# Patient Record
Sex: Female | Born: 1975 | Race: White | Hispanic: No | Marital: Married | Smoking: Never smoker
Health system: Southern US, Community
[De-identification: ages and names within clinical notes are randomized; demographics above are authoritative.]

## PROBLEM LIST (undated history)

## (undated) DIAGNOSIS — L732 Hidradenitis suppurativa: Secondary | ICD-10-CM

## (undated) DIAGNOSIS — T7840XA Allergy, unspecified, initial encounter: Secondary | ICD-10-CM

## (undated) HISTORY — DX: Hidradenitis suppurativa: L73.2

## (undated) HISTORY — PX: NECK SURGERY: SHX720

## (undated) HISTORY — DX: Allergy, unspecified, initial encounter: T78.40XA

---

## 2008-06-04 ENCOUNTER — Emergency Department: Payer: Self-pay | Admitting: Emergency Medicine

## 2008-08-10 ENCOUNTER — Ambulatory Visit: Payer: Self-pay | Admitting: Otolaryngology

## 2009-10-09 ENCOUNTER — Observation Stay: Payer: Self-pay | Admitting: Obstetrics and Gynecology

## 2009-10-16 ENCOUNTER — Inpatient Hospital Stay: Payer: Self-pay | Admitting: Unknown Physician Specialty

## 2009-11-04 ENCOUNTER — Ambulatory Visit: Payer: Self-pay | Admitting: Urology

## 2011-06-01 ENCOUNTER — Ambulatory Visit: Payer: Self-pay | Admitting: General Practice

## 2012-08-16 ENCOUNTER — Ambulatory Visit: Payer: Self-pay

## 2012-08-18 LAB — BETA STREP CULTURE(ARMC)

## 2016-01-24 DIAGNOSIS — Z01419 Encounter for gynecological examination (general) (routine) without abnormal findings: Secondary | ICD-10-CM | POA: Diagnosis not present

## 2016-07-11 DIAGNOSIS — L0291 Cutaneous abscess, unspecified: Secondary | ICD-10-CM | POA: Diagnosis not present

## 2016-07-11 DIAGNOSIS — N898 Other specified noninflammatory disorders of vagina: Secondary | ICD-10-CM | POA: Diagnosis not present

## 2016-12-06 ENCOUNTER — Ambulatory Visit: Payer: Self-pay | Admitting: Physician Assistant

## 2016-12-06 ENCOUNTER — Encounter: Payer: Self-pay | Admitting: Physician Assistant

## 2016-12-06 VITALS — BP 110/84 | HR 86 | Temp 98.6°F

## 2016-12-06 DIAGNOSIS — J0100 Acute maxillary sinusitis, unspecified: Secondary | ICD-10-CM

## 2016-12-06 MED ORDER — AMOXICILLIN 875 MG PO TABS: 875.0000 mg | ORAL_TABLET | Freq: Two times a day (BID) | ORAL | 0 refills | Status: DC

## 2016-12-06 MED ORDER — FLUCONAZOLE 150 MG PO TABS: ORAL_TABLET | ORAL | 0 refills | Status: DC

## 2016-12-06 NOTE — Progress Notes (Signed)
S: C/o runny nose and congestion for 3 days, some ear pain and sore throat,  no fever, chills, cp/sob, v/d; mucus is green and thick, cough is sporadic, c/o of facial and dental pain.   Using otc meds:   O: PE: vitals wnl, nad, perrl eomi, normocephalic, tms dull, nasal mucosa red and swollen, throat injected, neck supple no lymph, lungs c t a, cv rrr, neuro intact  A:  Acute sinusitis   P: drink fluids, continue regular meds , use otc meds of choice, return if not improving in 5 days, return earlier if worsening , amoxil, diflucan

## 2017-02-27 ENCOUNTER — Telehealth: Payer: Self-pay

## 2017-02-27 DIAGNOSIS — N764 Abscess of vulva: Secondary | ICD-10-CM | POA: Insufficient documentation

## 2017-02-27 MED ORDER — DOXYCYCLINE HYCLATE 100 MG PO CAPS: 100.0000 mg | ORAL_CAPSULE | Freq: Two times a day (BID) | ORAL | 0 refills | Status: DC

## 2017-02-27 NOTE — Telephone Encounter (Signed)
Pt states she saw [facility] last summer and was prescribed antibiotics for a bump. She is requesting the same antibiotic (doxycycline) because the bump has returned. Please advise. Thank you.

## 2017-03-13 NOTE — Progress Notes (Signed)
   HPI:      [name] is a 41 y.o. No obstetric history on file. who LMP was Patient's last menstrual period was 02/28/2017., presents today for a problem visit.  She complains of vaginal lesions/abscess that started 02/25/17. She has a hx of these and started doxy 100 mg BID for 2 wks on 02/28/17, as well as sitz baths/warm compresses. Her lesion started to improve but flared up again 3 days ago. It finally started to drain last night. It is very painful for pt and she can't even let her clothes touch it.   She has a hx of these in the past and had neg herpes culture. She was treated with zovirax crm by [facility] without sx relief. Sx usually resolve with sitz baths but not this time.  She doens't shave and her sister has similar sx.    Review of Systems  Constitutional: Negative for fever.  Gastrointestinal: Negative for blood in stool, constipation, diarrhea, nausea and vomiting.  Genitourinary: Negative for dyspareunia, dysuria, flank pain, frequency, hematuria, urgency, vaginal bleeding, vaginal discharge and vaginal pain.  Musculoskeletal: Negative for back pain.  Skin: Negative for rash.    No past medical history on file.  No family history on file.   OBJECTIVE:   Vitals:  BP 130/80   Ht 5\' 1"  (1.549 m)   Wt 186 lb (84.4 kg)   LMP 02/28/2017   BMI 35.14 kg/m   Physical Exam  Constitutional: She is oriented to person, place, and time and well-developed, well-nourished, and in no distress. Vital signs are normal.  Abdominal: Soft. Normal appearance. There is no hepatosplenomegaly. There is no tenderness.  Genitourinary: Uterus normal, cervix normal, right adnexa normal and left adnexa normal. Uterus is not enlarged. Cervix exhibits no motion tenderness and no tenderness. Right adnexum displays no mass and no tenderness. Left adnexum displays no mass and no tenderness. Vulva exhibits lesion and tenderness. Vulva exhibits no erythema, no exudate and no rash. Vagina exhibits no  lesion.  Neurological: She is oriented to person, place, and time.  Skin: No lesion and no rash noted. No erythema.  LT LABIA MAJORA/PERIANAL AREA WITH RESOLVING, ERYTHEMATOUS LESION, SEROUS D/C, TENDER TO PALPATE She has several scarred areas bilat vulva from previous lesions   Assessment/Plan: Vulvar lesion - Resolving but still present. Change to keflex Rx (pt aware to look for PCN allergy sx)/sitz baths. Refer to derm for hidradenitis supp eval/mgmt given recurr sx - Plan: cephALEXin (KEFLEX) 500 MG capsule, Ambulatory referral to Dermatology    Return if symptoms worsen or fail to improve.  Albena Comes B. Khasir Woodrome, PA-C 03/14/2017 10:43 AM

## 2017-03-14 ENCOUNTER — Encounter: Payer: Self-pay | Admitting: Obstetrics and Gynecology

## 2017-03-14 ENCOUNTER — Ambulatory Visit (INDEPENDENT_AMBULATORY_CARE_PROVIDER_SITE_OTHER): Payer: 59 | Admitting: Obstetrics and Gynecology

## 2017-03-14 VITALS — BP 130/80 | Ht 61.0 in | Wt 186.0 lb

## 2017-03-14 DIAGNOSIS — N9089 Other specified noninflammatory disorders of vulva and perineum: Secondary | ICD-10-CM

## 2017-03-14 MED ORDER — CEPHALEXIN 500 MG PO CAPS: 500.0000 mg | ORAL_CAPSULE | Freq: Two times a day (BID) | ORAL | 0 refills | Status: AC

## 2017-03-18 ENCOUNTER — Telehealth: Payer: Self-pay | Admitting: Obstetrics and Gynecology

## 2017-03-18 NOTE — Telephone Encounter (Signed)
Patient is scheduled with Dr Brendolyn Patty at [facility] on 04/11/17 @ 9:15am. Patient is aware and was given the phone#.

## 2017-04-11 DIAGNOSIS — L732 Hidradenitis suppurativa: Secondary | ICD-10-CM | POA: Diagnosis not present

## 2017-04-15 ENCOUNTER — Encounter: Payer: Self-pay | Admitting: Obstetrics and Gynecology

## 2017-04-15 DIAGNOSIS — L732 Hidradenitis suppurativa: Secondary | ICD-10-CM | POA: Insufficient documentation

## 2018-03-03 ENCOUNTER — Encounter: Payer: Self-pay | Admitting: Family Medicine

## 2018-03-03 ENCOUNTER — Ambulatory Visit (INDEPENDENT_AMBULATORY_CARE_PROVIDER_SITE_OTHER): Payer: 59 | Admitting: Family Medicine

## 2018-03-03 VITALS — BP 133/89 | HR 102 | Temp 98.1°F | Ht 60.9 in | Wt 188.8 lb

## 2018-03-03 DIAGNOSIS — Z7689 Persons encountering health services in other specified circumstances: Secondary | ICD-10-CM

## 2018-03-03 DIAGNOSIS — L732 Hidradenitis suppurativa: Secondary | ICD-10-CM

## 2018-03-03 DIAGNOSIS — J309 Allergic rhinitis, unspecified: Secondary | ICD-10-CM | POA: Diagnosis not present

## 2018-03-03 DIAGNOSIS — E669 Obesity, unspecified: Secondary | ICD-10-CM

## 2018-03-03 DIAGNOSIS — R635 Abnormal weight gain: Secondary | ICD-10-CM

## 2018-03-03 NOTE — Progress Notes (Signed)
   BP 133/89 (BP Location: Left Arm, Cuff Size: Normal)   Pulse (!) 102   Temp 98.1 F (36.7 C) (Oral)   Ht 5' 0.9" (1.547 m)   Wt 188 lb 12.8 oz (85.6 kg)   LMP 02/17/2018 (Approximate)   SpO2 98%   BMI 35.79 kg/m    Subjective:    Patient ID: Megan Hunter, female    DOB: [birth year], 42 y.o.   MRN: [id number]  HPI: Megan Hunter is a 42 y.o. female  Chief Complaint  Patient presents with  . Establish Care    no concerns or issues   Pt here today to establish care. No known medical issues other than allergic rhinitis and hx of hidradenitis with no current flares. Last CPE was about 5-6 years ago.   Only concern today is her weight. States the past few months since her personal trainer left she has gained almost 30 lb. Struggling to get back on track on her own but still trying to exercise when she can. Feels her diet could use some improvement. Denies CP, SOB, joint pains, known comorbidities.   Relevant past medical, surgical, family and social history reviewed and updated as indicated. Interim medical history since our last visit reviewed. Allergies and medications reviewed and updated.  Review of Systems  Per HPI unless specifically indicated above     Objective:    BP 133/89 (BP Location: Left Arm, Cuff Size: Normal)   Pulse (!) 102   Temp 98.1 F (36.7 C) (Oral)   Ht 5' 0.9" (1.547 m)   Wt 188 lb 12.8 oz (85.6 kg)   LMP 02/17/2018 (Approximate)   SpO2 98%   BMI 35.79 kg/m   Wt Readings from Last 3 Encounters:  03/03/18 188 lb 12.8 oz (85.6 kg)  03/14/17 186 lb (84.4 kg)    Physical Exam  Constitutional: She is oriented to person, place, and time. She appears well-developed. No distress.  HENT:  Head: Atraumatic.  Eyes: Conjunctivae are normal. Pupils are equal, round, and reactive to light.  Neck: Normal range of motion. Neck supple.  Cardiovascular: Normal rate, regular rhythm and normal heart sounds.  Pulmonary/Chest: Effort normal and breath sounds  normal. No respiratory distress.  Musculoskeletal: Normal range of motion.  Neurological: She is alert and oriented to person, place, and time.  Skin: Skin is warm and dry.  Psychiatric: She has a normal mood and affect. Her behavior is normal.  Nursing note and vitals reviewed.   No results found for this or any previous visit.    Assessment & Plan:   Problem List Items Addressed This Visit      Respiratory   Allergic rhinitis    Managed with claritin and flonase, continue current regimen.         Musculoskeletal and Integument   Hidradenitis suppurativa    No current flares, followed by Dermatology        Other   Obesity (BMI 35.0-39.9 without comorbidity)    Other Visit Diagnoses    Weight gain    -  Primary   Referral placed to [facility] for dietary counseling. Continue exercising regularly, recommended establishing with a new trainer since this seems to help   Relevant Orders   Amb Referral to Nutrition and Diabetic E   Encounter to establish care           Follow up plan: Return for CPE.

## 2018-03-05 DIAGNOSIS — E669 Obesity, unspecified: Secondary | ICD-10-CM | POA: Insufficient documentation

## 2018-03-05 DIAGNOSIS — J309 Allergic rhinitis, unspecified: Secondary | ICD-10-CM | POA: Insufficient documentation

## 2018-03-05 NOTE — Assessment & Plan Note (Signed)
No current flares, followed by Dermatology

## 2018-03-05 NOTE — Patient Instructions (Signed)
Follow up for CPE 

## 2018-03-05 NOTE — Assessment & Plan Note (Signed)
Managed with claritin and flonase, continue current regimen.

## 2018-03-11 ENCOUNTER — Other Ambulatory Visit: Payer: 59

## 2018-03-17 ENCOUNTER — Other Ambulatory Visit: Payer: 59

## 2018-03-20 ENCOUNTER — Encounter: Payer: 59 | Attending: Family Medicine | Admitting: Dietician

## 2018-03-20 ENCOUNTER — Encounter: Payer: Self-pay | Admitting: Dietician

## 2018-03-20 VITALS — Ht 61.0 in | Wt 190.0 lb

## 2018-03-20 DIAGNOSIS — Z713 Dietary counseling and surveillance: Secondary | ICD-10-CM | POA: Diagnosis not present

## 2018-03-20 NOTE — Progress Notes (Signed)
Notes from [facility] employee "self referral" nutrition session: Start time: 1315   End [phone]  Met with employee to discuss his/her nutritional concerns and diet history. The employee's questions/concerns were also addressed.  Progress: Patient reports weight gain of about 25-30lbs in past 2 years, after personal trainer left --and has not been able to reach the same level of exercise since; currently occasional exercise at home. She now has been diagnosed with hidradenitis which causes blister-type rash on skin making exercise very uncomfortable. She is working to increase general daily steps/ activity, and has been working on [employer]. Weight gain has stopped over the past few months. She has been using a portion, balanced eating plan to track intake.   Typical dietary intake: Breakfast: boiled eggs at home or work, sometimes with bacon Lunch: 11am-- [employer] cafeteria grilled chicken and vegetables, taco bar,  Snack: yogurt Dinner: grilled chicken or salmon or shrimp with veg asparagus, potatoes, fruit i.e grilled pineapple; [place] sandwich on wheat bread; occasionally pasta; rarely fried food.  Snack: sometimes cashews or peanuts Beverages: water, some diet soda 1 per day, light lemonade  We discussed the following topics:  Healthy Eating-- 1200-1300kcal daily goal, with 9 servings carbs, 5-6oz proteins, 5 servings added fats, and unlimited low-carb vegetables daily.   Exercise-- role of exercise in weight management   I also provided the following handouts as reinforcement of the educational session:  Plate planner with food lists.  Goals and Instructions  Plan: Patient states she will work further on portion control, kcal control, and increasing exercise.   Follow-up scheduled for 05/15/18.

## 2018-03-20 NOTE — Patient Instructions (Signed)
   Monitor food portions closely, aim for 1/2-1 cup starch/ carb, 1-4oz protein, and 1c or more of low-carb veggies.   Goal for calories is 1200-1300 daily.   Continue to work on increasing exercise.   Great job making healthy food choices!

## 2018-03-21 ENCOUNTER — Encounter: Payer: Self-pay | Admitting: Family Medicine

## 2018-03-21 ENCOUNTER — Ambulatory Visit (INDEPENDENT_AMBULATORY_CARE_PROVIDER_SITE_OTHER): Payer: 59 | Admitting: Family Medicine

## 2018-03-21 ENCOUNTER — Other Ambulatory Visit: Payer: Self-pay | Admitting: Family Medicine

## 2018-03-21 VITALS — BP 131/95 | HR 94 | Temp 97.7°F | Ht 61.0 in | Wt 185.3 lb

## 2018-03-21 DIAGNOSIS — Z0000 Encounter for general adult medical examination without abnormal findings: Secondary | ICD-10-CM | POA: Diagnosis not present

## 2018-03-21 LAB — UA/M W/RFLX CULTURE, ROUTINE
BILIRUBIN UA: NEGATIVE
GLUCOSE, UA: NEGATIVE
KETONES UA: NEGATIVE
Leukocytes, UA: NEGATIVE
Nitrite, UA: NEGATIVE
PROTEIN UA: NEGATIVE
RBC, UA: NEGATIVE
SPEC GRAV UA: 1.02 (ref 1.005–1.030)
Urobilinogen, Ur: 0.2 mg/dL (ref 0.2–1.0)
pH, UA: 6 (ref 5.0–7.5)

## 2018-03-21 NOTE — Progress Notes (Signed)
BP (!) 131/95 (BP Location: Left Arm, Patient Position: Sitting, Cuff Size: Normal)   Pulse 94   Temp 97.7 F (36.5 C) (Oral)   Ht 5\' 1"  (1.549 m)   Wt 185 lb 4.8 oz (84.1 kg)   SpO2 100%   BMI 35.01 kg/m    Subjective:    Patient ID: Megan Hunter, female    DOB: November 24, 1976, 42 y.o.   MRN: [id number]  HPI: Megan Hunter is a 42 y.o. female presenting on 03/21/2018 for comprehensive medical examination. Current medical complaints include:none  Thinks she's had a tdap recently, will check with her work on that.   Gets paps and breast exams done at [facility] done today and results listed below:  Depression screen [facility] 2/9 03/21/2018 03/20/2018 03/03/2018  Decreased Interest 0 0 0  Down, Depressed, Hopeless 0 0 0  PHQ - 2 Score 0 0 0  Altered sleeping 1 - 1  Tired, decreased energy 0 - 0  Change in appetite 0 - 1  Feeling bad or failure about yourself  0 - 0  Trouble concentrating 0 - 0  Moving slowly or fidgety/restless 0 - 0  Suicidal thoughts 0 - 0  PHQ-9 Score 1 - 2    The patient does not have a history of falls. I did not complete a risk assessment for falls. A plan of care for falls was not documented.   Past Medical History:  Past Medical History:  Diagnosis Date  . Allergy   . Hidradenitis suppurativa     Surgical History:  Past Surgical History:  Procedure Laterality Date  . CESAREAN SECTION  2010  . NECK SURGERY     pt sattes she had a bug bite on her neck as a child that she had to have surgery for    Medications:  Current Outpatient Medications on File Prior to Visit  Medication Sig  . fluticasone (FLONASE) 50 MCG/ACT nasal spray Place 1 spray into both nostrils daily.  Marland Kitchen guaiFENesin (MUCINEX) 600 MG 12 hr tablet Take 600 mg by mouth 2 (two) times daily as needed.  . loratadine (CLARITIN) 10 MG tablet Take 10 mg by mouth daily as needed for allergies.  . Multiple Vitamin (MULTIVITAMIN) tablet Take 1 tablet by mouth daily.   No  current facility-administered medications on file prior to visit.     Allergies:  Allergies  Allergen Reactions  . Amoxicillin Hives  . Latex Itching and Rash    Social History:  Social History   Socioeconomic History  . Marital status: Married    Spouse name: Not on file  . Number of children: Not on file  . Years of education: Not on file  . Highest education level: Not on file  Occupational History  . Not on file  Social Needs  . Financial resource strain: Not on file  . Food insecurity:    Worry: Not on file    Inability: Not on file  . Transportation needs:    Medical: Not on file    Non-medical: Not on file  Tobacco Use  . Smoking status: Never Smoker  . Smokeless tobacco: Never Used  Substance and Sexual Activity  . Alcohol use: Yes    Comment: occasional  . Drug use: No  . Sexual activity: Yes  Lifestyle  . Physical activity:    Days per week: Not on file    Minutes per session: Not on file  . Stress: Not on file  Relationships  .  Social connections:    Talks on phone: Not on file    Gets together: Not on file    Attends religious service: Not on file    Active member of club or organization: Not on file    Attends meetings of clubs or organizations: Not on file    Relationship status: Not on file  . Intimate partner violence:    Fear of current or ex partner: Not on file    Emotionally abused: Not on file    Physically abused: Not on file    Forced sexual activity: Not on file  Other Topics Concern  . Not on file  Social History Narrative  . Not on file   Social History   Tobacco Use  Smoking Status Never Smoker  Smokeless Tobacco Never Used   Social History   Substance and Sexual Activity  Alcohol Use Yes   Comment: occasional    Family History:  Family History  Problem Relation Age of Onset  . COPD Mother   . Hyperlipidemia Father   . Diabetes Father        pre-diabetic  . Cancer Maternal Grandfather        lung  . Stroke  Paternal Grandfather     Past medical history, surgical history, medications, allergies, family history and social history reviewed with patient today and changes made to appropriate areas of the chart.   Review of Systems - General ROS: negative Psychological ROS: negative Ophthalmic ROS: negative ENT ROS: negative Allergy and Immunology ROS: negative Breast ROS: negative for breast lumps Respiratory ROS: no cough, shortness of breath, or wheezing Cardiovascular ROS: no chest pain or dyspnea on exertion Gastrointestinal ROS: no abdominal pain, change in bowel habits, or black or bloody stools Genito-Urinary ROS: no dysuria, trouble voiding, or hematuria Musculoskeletal ROS: negative Neurological ROS: no TIA or stroke symptoms Dermatological ROS: negative All other ROS negative except what is listed above and in the HPI.      Objective:    BP (!) 131/95 (BP Location: Left Arm, Patient Position: Sitting, Cuff Size: Normal)   Pulse 94   Temp 97.7 F (36.5 C) (Oral)   Ht 5\' 1"  (1.549 m)   Wt 185 lb 4.8 oz (84.1 kg)   SpO2 100%   BMI 35.01 kg/m   Wt Readings from Last 3 Encounters:  03/21/18 185 lb 4.8 oz (84.1 kg)  03/20/18 190 lb (86.2 kg)  03/03/18 188 lb 12.8 oz (85.6 kg)    Physical Exam  Constitutional: She is oriented to person, place, and time. She appears well-developed and well-nourished. No distress.  HENT:  Head: Atraumatic.  Right Ear: External ear normal.  Left Ear: External ear normal.  Nose: Nose normal.  Mouth/Throat: Oropharynx is clear and moist. No oropharyngeal exudate.  Eyes: Pupils are equal, round, and reactive to light. Conjunctivae are normal. No scleral icterus.  Neck: Normal range of motion. Neck supple. No thyromegaly present.  Cardiovascular: Normal rate, regular rhythm, normal heart sounds and intact distal pulses.  Pulmonary/Chest: Effort normal and breath sounds normal. No respiratory distress.  Breast exams done with GYN  Abdominal:  Soft. Bowel sounds are normal. She exhibits no mass. There is no tenderness.  Genitourinary:  Genitourinary Comments: Done with GYN  Musculoskeletal: Normal range of motion. She exhibits no edema or tenderness.  Lymphadenopathy:    She has no cervical adenopathy.  Neurological: She is alert and oriented to person, place, and time. No cranial nerve deficit.  Skin: Skin is  warm and dry. No rash noted.  Psychiatric: She has a normal mood and affect. Her behavior is normal.  Nursing note and vitals reviewed.   No results found for this or any previous visit.    Assessment & Plan:   Problem List Items Addressed This Visit    None    Visit Diagnoses    Annual physical exam    -  Primary   Fasting labs today, no concerns   Relevant Orders   CBC with Differential/Platelet   Comprehensive metabolic panel   Lipid Panel w/o Chol/HDL Ratio   TSH   UA/M w/rflx Culture, Routine       Follow up plan: Return in about 1 year (around 03/22/2019) for CPE.   LABORATORY TESTING:  - Pap smear: done elsewhere  IMMUNIZATIONS:   - Tdap: Tetanus vaccination status reviewed: thinks she's UTD, but will check with employer for date. - Influenza: Up to date  PATIENT COUNSELING:   Advised to take 1 mg of folate supplement per day if capable of pregnancy.   Sexuality: Discussed sexually transmitted diseases, partner selection, use of condoms, avoidance of unintended pregnancy  and contraceptive alternatives.   Advised to avoid cigarette smoking.  I discussed with the patient that most people either abstain from alcohol or drink within safe limits (<=14/week and <=4 drinks/occasion for males, <=7/weeks and <= 3 drinks/occasion for females) and that the risk for alcohol disorders and other health effects rises proportionally with the number of drinks per week and how often a drinker exceeds daily limits.  Discussed cessation/primary prevention of drug use and availability of treatment for abuse.    Diet: Encouraged to adjust caloric intake to maintain  or achieve ideal body weight, to reduce intake of dietary saturated fat and total fat, to limit sodium intake by avoiding high sodium foods and not adding table salt, and to maintain adequate dietary potassium and calcium preferably from fresh fruits, vegetables, and low-fat dairy products.    stressed the importance of regular exercise  Injury prevention: Discussed safety belts, safety helmets, smoke detector, smoking near bedding or upholstery.   Dental health: Discussed importance of regular tooth brushing, flossing, and dental visits.    NEXT PREVENTATIVE PHYSICAL DUE IN 1 YEAR. Return in about 1 year (around 03/22/2019) for CPE.

## 2018-03-21 NOTE — Patient Instructions (Signed)
Follow up in 1 year for CPE 

## 2018-03-22 LAB — COMPREHENSIVE METABOLIC PANEL
ALT: 18 IU/L (ref 0–32)
AST: 15 IU/L (ref 0–40)
Albumin/Globulin Ratio: 1.6 (ref 1.2–2.2)
Albumin: 4.2 g/dL (ref 3.5–5.5)
Alkaline Phosphatase: 32 IU/L — ABNORMAL LOW (ref 39–117)
BUN/Creatinine Ratio: 21 (ref 9–23)
BUN: 12 mg/dL (ref 6–20)
Bilirubin Total: 0.3 mg/dL (ref 0.0–1.2)
CALCIUM: 9.6 mg/dL (ref 8.7–10.2)
CO2: 22 mmol/L (ref 20–29)
CREATININE: 0.58 mg/dL (ref 0.57–1.00)
Chloride: 102 mmol/L (ref 96–106)
GFR calc Af Amer: 134 mL/min/{1.73_m2} (ref >59)
GFR, EST NON AFRICAN AMERICAN: 116 mL/min/{1.73_m2} (ref >59)
GLOBULIN, TOTAL: 2.6 g/dL (ref 1.5–4.5)
Glucose: 84 mg/dL (ref 65–99)
Potassium: 4.4 mmol/L (ref 3.5–5.2)
SODIUM: 138 mmol/L (ref 134–144)
TOTAL PROTEIN: 6.8 g/dL (ref 6.0–8.5)

## 2018-03-22 LAB — CBC WITH DIFFERENTIAL/PLATELET
BASOS: 0 %
Basophils Absolute: 0 10*3/uL (ref 0.0–0.2)
EOS (ABSOLUTE): 0.1 10*3/uL (ref 0.0–0.4)
EOS: 2 %
HEMATOCRIT: 39.5 % (ref 34.0–46.6)
HEMOGLOBIN: 13.2 g/dL (ref 11.1–15.9)
Immature Grans (Abs): 0 10*3/uL (ref 0.0–0.1)
Immature Granulocytes: 0 %
Lymphocytes Absolute: 2.2 10*3/uL (ref 0.7–3.1)
Lymphs: 38 %
MCH: 28.9 pg (ref 26.6–33.0)
MCHC: 33.4 g/dL (ref 31.5–35.7)
MCV: 86 fL (ref 79–97)
MONOCYTES: 7 %
MONOS ABS: 0.4 10*3/uL (ref 0.1–0.9)
NEUTROS PCT: 53 %
Neutrophils Absolute: 3.1 10*3/uL (ref 1.4–7.0)
Platelets: 246 10*3/uL (ref 150–379)
RBC: 4.57 x10E6/uL (ref 3.77–5.28)
RDW: 13.9 % (ref 12.3–15.4)
WBC: 5.9 10*3/uL (ref 3.4–10.8)

## 2018-03-22 LAB — LIPID PANEL W/O CHOL/HDL RATIO
Cholesterol, Total: 213 mg/dL — ABNORMAL HIGH (ref 100–199)
HDL: 57 mg/dL (ref >39)
LDL CALC: 132 mg/dL — AB (ref 0–99)
TRIGLYCERIDES: 122 mg/dL (ref 0–149)
VLDL Cholesterol Cal: 24 mg/dL (ref 5–40)

## 2018-03-22 LAB — TSH: TSH: 3 u[IU]/mL (ref 0.450–4.500)

## 2018-05-07 DIAGNOSIS — H5213 Myopia, bilateral: Secondary | ICD-10-CM | POA: Diagnosis not present

## 2018-05-15 ENCOUNTER — Ambulatory Visit: Payer: Self-pay | Admitting: Dietician

## 2018-05-21 ENCOUNTER — Encounter: Payer: 59 | Attending: Family Medicine | Admitting: Dietician

## 2018-05-21 ENCOUNTER — Encounter: Payer: Self-pay | Admitting: Dietician

## 2018-05-21 VITALS — Ht 61.0 in | Wt 184.3 lb

## 2018-05-21 DIAGNOSIS — Z713 Dietary counseling and surveillance: Secondary | ICD-10-CM | POA: Insufficient documentation

## 2018-05-21 NOTE — Progress Notes (Signed)
[employer] Employee "self referral" nutrition session: Start time: 1315   End time: 1330  Height: 5'1" Weight: 184.4lbs  Met with employee to discuss his/her nutritional concerns and diet history.   Progress: Patient reports eating smaller food portions overall, more fruit, and more exercise since previous visit on 03/20/18.  Physical activity: recreational swimming 1-2x a week-- plans to start swimming daily; walking daily and aiming for 10000 steps  Typical eating pattern: Breakfast: boiled eggs, occasionally with bacon (if at work) Snack: fruit: banana, berries, Lunch: 5/29 salmon and veggies, similar at [facility]  Snack: nuts  occasionally granola bar Supper: grilled meat with veggies, maybe potatoes, or sandwich Snack: none Beverages: 32oz water, 1-2 diet sodas or light lemonade   Education topics covered during this visit:  Weight Concerns    Additional Comments: weight loss of about 6lbs since 03/20/18. Patient feels she is doing well, denies and difficulties.     Plan:  Continue with current eating pattern and regular exercise.  Next visit -- 07/24/18

## 2018-05-21 NOTE — Patient Instructions (Signed)
Continue with current eating pattern and regular exercise 

## 2018-06-06 DIAGNOSIS — L0291 Cutaneous abscess, unspecified: Secondary | ICD-10-CM | POA: Diagnosis not present

## 2018-06-06 DIAGNOSIS — L918 Other hypertrophic disorders of the skin: Secondary | ICD-10-CM | POA: Diagnosis not present

## 2018-06-06 DIAGNOSIS — L732 Hidradenitis suppurativa: Secondary | ICD-10-CM | POA: Diagnosis not present

## 2018-06-09 ENCOUNTER — Encounter: Payer: Self-pay | Admitting: Obstetrics and Gynecology

## 2018-06-09 ENCOUNTER — Ambulatory Visit (INDEPENDENT_AMBULATORY_CARE_PROVIDER_SITE_OTHER): Payer: 59 | Admitting: Obstetrics and Gynecology

## 2018-06-09 VITALS — BP 136/90 | HR 95 | Ht 62.0 in | Wt 184.0 lb

## 2018-06-09 DIAGNOSIS — Z01419 Encounter for gynecological examination (general) (routine) without abnormal findings: Secondary | ICD-10-CM

## 2018-06-09 DIAGNOSIS — L732 Hidradenitis suppurativa: Secondary | ICD-10-CM

## 2018-06-09 NOTE — Patient Instructions (Signed)
I value your feedback and entrusting us with your care. If you get a [facility] patient survey, I would appreciate you taking the time to let us know about your experience today. Thank you! 

## 2018-06-09 NOTE — Progress Notes (Signed)
PCP:  Volney American, PA-C   Chief Complaint  Patient presents with  . Gynecologic Exam     HPI:      [name] is a 42 y.o. No obstetric history on file. who LMP was Patient's last menstrual period was 06/01/2018 (exact date)., presents today for her annual examination.  Her menses are regular every 28-30 days, lasting 5 days.  Dysmenorrhea mild, occurring first 1-2 days of flow. She does not have intermenstrual bleeding.  Sex activity: single partner, contraception - condoms, declines other BC Last Pap: January 24, 2016  Results were: no abnormalities /neg HPV DNA  Hx of STDs: none  There is no FH of breast cancer. There is no FH of ovarian cancer. The patient does do self-breast exams.  Tobacco use: The patient denies current or previous tobacco use. Alcohol use: social drinker No drug use.  Exercise: moderately active  She does get adequate calcium but not Vitamin D in her diet.   Past Medical History:  Diagnosis Date  . Allergy   . Hidradenitis 03/2017  . Hidradenitis suppurativa     Past Surgical History:  Procedure Laterality Date  . CESAREAN SECTION  2010  . NECK SURGERY     pt sattes she had a bug bite on her neck as a child that she had to have surgery for    Family History  Problem Relation Age of Onset  . COPD Mother   . Hyperlipidemia Father   . Diabetes Father        pre-diabetic  . Cancer Maternal Grandfather        lung  . Stroke Paternal Grandfather     Social History   Socioeconomic History  . Marital status: Married    Spouse name: Not on file  . Number of children: Not on file  . Years of education: Not on file  . Highest education level: Not on file  Occupational History  . Not on file  Social Needs  . Financial resource strain: Not on file  . Food insecurity:    Worry: Not on file    Inability: Not on file  . Transportation needs:    Medical: Not on file    Non-medical: Not on file  Tobacco Use  . Smoking  status: Never Smoker  . Smokeless tobacco: Never Used  Substance and Sexual Activity  . Alcohol use: Yes    Comment: occasional  . Drug use: No  . Sexual activity: Yes    Birth control/protection: Condom  Lifestyle  . Physical activity:    Days per week: Not on file    Minutes per session: Not on file  . Stress: Not on file  Relationships  . Social connections:    Talks on phone: Not on file    Gets together: Not on file    Attends religious service: Not on file    Active member of club or organization: Not on file    Attends meetings of clubs or organizations: Not on file    Relationship status: Not on file  . Intimate partner violence:    Fear of current or ex partner: Not on file    Emotionally abused: Not on file    Physically abused: Not on file    Forced sexual activity: Not on file  Other Topics Concern  . Not on file  Social History Narrative  . Not on file    Outpatient Medications Prior to Visit  Medication Sig Dispense  Refill  . clindamycin (CLEOCIN T) 1 % external solution   4  . doxycycline (MONODOX) 100 MG capsule   3  . fluticasone (FLONASE) 50 MCG/ACT nasal spray Place 1 spray into both nostrils daily.    Marland Kitchen loratadine (CLARITIN) 10 MG tablet Take 10 mg by mouth daily as needed for allergies.    . Multiple Vitamin (MULTIVITAMIN) tablet Take 1 tablet by mouth daily.    Marland Kitchen guaiFENesin (MUCINEX) 600 MG 12 hr tablet Take 600 mg by mouth 2 (two) times daily as needed.     No facility-administered medications prior to visit.       ROS:  Review of Systems  Constitutional: Negative for fatigue, fever and unexpected weight change.  Respiratory: Negative for cough, shortness of breath and wheezing.   Cardiovascular: Negative for chest pain, palpitations and leg swelling.  Gastrointestinal: Negative for blood in stool, constipation, diarrhea, nausea and vomiting.  Endocrine: Negative for cold intolerance, heat intolerance and polyuria.  Genitourinary: Negative  for dyspareunia, dysuria, flank pain, frequency, genital sores, hematuria, menstrual problem, pelvic pain, urgency, vaginal bleeding, vaginal discharge and vaginal pain.  Musculoskeletal: Negative for back pain, joint swelling and myalgias.  Skin: Positive for wound. Negative for rash.  Neurological: Negative for dizziness, syncope, light-headedness, numbness and headaches.  Hematological: Negative for adenopathy.  Psychiatric/Behavioral: Negative for agitation, confusion, sleep disturbance and suicidal ideas. The patient is not nervous/anxious.    BREAST: No symptoms   Objective: BP 136/90   Pulse 95   Ht 5\' 2"  (1.575 m)   Wt 184 lb (83.5 kg)   LMP 06/01/2018 (Exact Date)   BMI 33.65 kg/m    Physical Exam  Constitutional: She is oriented to person, place, and time. She appears well-developed and well-nourished.  Genitourinary: Vagina normal and uterus normal. There is no rash or tenderness on the right labia. There is no rash or tenderness on the left labia. No erythema or tenderness in the vagina. No vaginal discharge found. Right adnexum does not display mass and does not display tenderness. Left adnexum does not display mass and does not display tenderness. Cervix does not exhibit motion tenderness or polyp. Uterus is not enlarged or tender.  Neck: Normal range of motion. No thyromegaly present.  Cardiovascular: Normal rate, regular rhythm and normal heart sounds.  No murmur heard. Pulmonary/Chest: Effort normal and breath sounds normal. Right breast exhibits no mass, no nipple discharge, no skin change and no tenderness. Left breast exhibits no mass, no nipple discharge, no skin change and no tenderness.  Abdominal: Soft. There is no tenderness. There is no guarding.  Musculoskeletal: Normal range of motion.  Neurological: She is alert and oriented to person, place, and time. No cranial nerve deficit.  Psychiatric: She has a normal mood and affect. Her behavior is normal.  Vitals  reviewed.   Assessment/Plan: Encounter for annual routine gynecological examination  Hidradenitis suppurativa - Seeing derm. Currently has lesion in RT axilla treated with abx and I&D.   Pap and Mammo due next year.          GYN counsel breast self exam, mammography screening, adequate intake of calcium and vitamin D, diet and exercise     F/U  Return in about 1 year (around 06/10/2019).  Alicia B. Copland, PA-C 06/09/2018 11:36 AM

## 2018-07-24 ENCOUNTER — Encounter: Payer: Self-pay | Admitting: Dietician

## 2018-07-24 ENCOUNTER — Encounter: Payer: 59 | Attending: Family Medicine | Admitting: Dietician

## 2018-07-24 VITALS — Ht 61.0 in | Wt 184.1 lb

## 2018-07-24 DIAGNOSIS — Z713 Dietary counseling and surveillance: Secondary | ICD-10-CM | POA: Insufficient documentation

## 2018-07-24 NOTE — Progress Notes (Signed)
[employer] Employee "self referral" nutrition session: Start time: 1320   End time: 1340  Height: 5'1" Weight: 184.1lbs  Met with employee to discuss his/her nutritional concerns and diet history.   Diet history:   Typical eating pattern: Breakfast: boiled eggs, sometimes with bacon  Snack: fruit Lunch: usually fish and veggies, or just vegetables; Thursday is "cheat" day-- ate burger sliders today Snack: fruit Supper: more restaurant meals in past week, healthy choices Snack: none Beverages: water, diet soda, light lemonade, or 1/2-sweet tea   Education topics covered during this visit:  General nutrition/ Healthy eating  Exercise  Weight Concerns   Educational resources provided:   Additional Comments: Patient has had several obstacles in the past weeks to limit exercise. She continues to consume healthy foods and control portions. She has plans to continue working on weight control along with her husband, in preparation for cruise vacation next spring.     Plan:   Continue with current healthy food choices and eating pattern  Gradually increase physical activity as able; consider exercises to do indoors/ in office during the day.

## 2018-07-24 NOTE — Patient Instructions (Signed)
   Continue with current healthy food choices and eating pattern  Gradually increase physical activity as able; consider exercises to do indoors/ in office during the day

## 2018-09-01 DIAGNOSIS — M7631 Iliotibial band syndrome, right leg: Secondary | ICD-10-CM | POA: Diagnosis not present

## 2018-09-01 DIAGNOSIS — M7632 Iliotibial band syndrome, left leg: Secondary | ICD-10-CM | POA: Diagnosis not present

## 2018-09-01 DIAGNOSIS — M222X9 Patellofemoral disorders, unspecified knee: Secondary | ICD-10-CM | POA: Diagnosis not present

## 2018-12-31 ENCOUNTER — Telehealth: Payer: Self-pay

## 2018-12-31 NOTE — Telephone Encounter (Signed)
Pt has yeast inf.  Wants diflucan called in.  Has been on antibx for hydradenitis.  [phone]

## 2019-01-02 NOTE — Telephone Encounter (Signed)
Pt calling again. Asking for diflucan to be called in to [facility].  Taking antibx for hydradenitis and has yeast inf.

## 2019-01-04 ENCOUNTER — Other Ambulatory Visit: Payer: Self-pay | Admitting: Obstetrics and Gynecology

## 2019-01-04 MED ORDER — FLUCONAZOLE 150 MG PO TABS: 150.0000 mg | ORAL_TABLET | Freq: Once | ORAL | 0 refills | Status: AC

## 2019-01-04 NOTE — Progress Notes (Signed)
Rx diflucan for yeast sx on abx for HS

## 2019-01-04 NOTE — Telephone Encounter (Signed)
Rx eRxd.  

## 2019-05-27 DIAGNOSIS — L0291 Cutaneous abscess, unspecified: Secondary | ICD-10-CM | POA: Diagnosis not present

## 2019-05-27 DIAGNOSIS — L732 Hidradenitis suppurativa: Secondary | ICD-10-CM | POA: Diagnosis not present

## 2019-06-24 DIAGNOSIS — L02412 Cutaneous abscess of left axilla: Secondary | ICD-10-CM | POA: Diagnosis not present

## 2019-06-24 DIAGNOSIS — L732 Hidradenitis suppurativa: Secondary | ICD-10-CM | POA: Diagnosis not present

## 2019-06-24 DIAGNOSIS — L0291 Cutaneous abscess, unspecified: Secondary | ICD-10-CM | POA: Diagnosis not present

## 2019-07-02 DIAGNOSIS — L732 Hidradenitis suppurativa: Secondary | ICD-10-CM | POA: Diagnosis not present

## 2019-07-02 DIAGNOSIS — L249 Irritant contact dermatitis, unspecified cause: Secondary | ICD-10-CM | POA: Diagnosis not present

## 2019-07-02 DIAGNOSIS — L02412 Cutaneous abscess of left axilla: Secondary | ICD-10-CM | POA: Diagnosis not present

## 2019-07-02 DIAGNOSIS — D1800 Hemangioma unspecified site: Secondary | ICD-10-CM | POA: Diagnosis not present

## 2019-09-02 DIAGNOSIS — L732 Hidradenitis suppurativa: Secondary | ICD-10-CM | POA: Diagnosis not present

## 2019-09-29 NOTE — Progress Notes (Signed)
PCP:  Volney American, PA-C   Chief Complaint  Patient presents with  . Gynecologic Exam     HPI:      [name] is a 43 y.o. No obstetric history on file. who LMP was Patient's last menstrual period was 09/17/2019 (exact date)., presents today for her annual examination.  Her menses are regular every 28-30 days, lasting 3-4 days, heavier but shorter now. Still with mod flow.  Dysmenorrhea mild, occurring first 1-2 days of flow. She does not have intermenstrual bleeding.  Sex activity: single partner, contraception - condoms, declines other BC Last Pap: January 24, 2016  Results were: no abnormalities /neg HPV DNA  Hx of STDs: none  Last mammo: never There is no FH of breast cancer. There is no FH of ovarian cancer. The patient does do self-breast exams.  Tobacco use: The patient denies current or previous tobacco use. Alcohol use: social drinker No drug use.  Exercise: moderately active  She does get adequate calcium but not Vitamin D in her diet. Labs with PCP.  Hx of hidradenitis suppurativa, followed by derm. Sx worse in summer. Was on mult abx this summer with yeast vag; needs Rx RF on diflucan prn yeast vag sx.   Past Medical History:  Diagnosis Date  . Allergy   . Hidradenitis 03/2017  . Hidradenitis suppurativa     Past Surgical History:  Procedure Laterality Date  . CESAREAN SECTION  2010  . NECK SURGERY     pt sattes she had a bug bite on her neck as a child that she had to have surgery for    Family History  Problem Relation Age of Onset  . COPD Mother   . Hyperlipidemia Father   . Diabetes Father        pre-diabetic  . Cancer Maternal Grandfather        lung  . Stroke Paternal Grandfather     Social History   Socioeconomic History  . Marital status: Married    Spouse name: Not on file  . Number of children: Not on file  . Years of education: Not on file  . Highest education level: Not on file  Occupational History  . Not on  file  Social Needs  . Financial resource strain: Not on file  . Food insecurity    Worry: Not on file    Inability: Not on file  . Transportation needs    Medical: Not on file    Non-medical: Not on file  Tobacco Use  . Smoking status: Never Smoker  . Smokeless tobacco: Never Used  Substance and Sexual Activity  . Alcohol use: Yes    Comment: occasional  . Drug use: No  . Sexual activity: Yes    Birth control/protection: Condom  Lifestyle  . Physical activity    Days per week: Not on file    Minutes per session: Not on file  . Stress: Not on file  Relationships  . Social Herbalist on phone: Not on file    Gets together: Not on file    Attends religious service: Not on file    Active member of club or organization: Not on file    Attends meetings of clubs or organizations: Not on file    Relationship status: Not on file  . Intimate partner violence    Fear of current or ex partner: Not on file    Emotionally abused: Not on file  Physically abused: Not on file    Forced sexual activity: Not on file  Other Topics Concern  . Not on file  Social History Narrative  . Not on file    Outpatient Medications Prior to Visit  Medication Sig Dispense Refill  . clindamycin (CLEOCIN T) 1 % external solution   4  . Clindamycin Phosphate foam     . doxycycline (MONODOX) 100 MG capsule   3  . fluticasone (FLONASE) 50 MCG/ACT nasal spray Place 1 spray into both nostrils daily.    Marland Kitchen guaiFENesin (MUCINEX) 600 MG 12 hr tablet Take 600 mg by mouth 2 (two) times daily as needed.    . loratadine (CLARITIN) 10 MG tablet Take 10 mg by mouth daily as needed for allergies.    . Multiple Vitamin (MULTIVITAMIN) tablet Take 1 tablet by mouth daily.    Marland Kitchen spironolactone (ALDACTONE) 25 MG tablet      No facility-administered medications prior to visit.       ROS:  Review of Systems  Constitutional: Negative for fatigue, fever and unexpected weight change.  Respiratory:  Negative for cough, shortness of breath and wheezing.   Cardiovascular: Negative for chest pain, palpitations and leg swelling.  Gastrointestinal: Negative for blood in stool, constipation, diarrhea, nausea and vomiting.  Endocrine: Negative for cold intolerance, heat intolerance and polyuria.  Genitourinary: Negative for dyspareunia, dysuria, flank pain, frequency, genital sores, hematuria, menstrual problem, pelvic pain, urgency, vaginal bleeding, vaginal discharge and vaginal pain.  Musculoskeletal: Negative for back pain, joint swelling and myalgias.  Skin: Negative for rash and wound.  Neurological: Negative for dizziness, syncope, light-headedness, numbness and headaches.  Hematological: Negative for adenopathy.  Psychiatric/Behavioral: Negative for agitation, confusion, sleep disturbance and suicidal ideas. The patient is not nervous/anxious.   BREAST: No symptoms   Objective: BP 110/80   Ht 5\' 2"  (1.575 m)   Wt 177 lb (80.3 kg)   LMP 09/17/2019 (Exact Date)   BMI 32.37 kg/m    Physical Exam Constitutional:      Appearance: She is well-developed.  Genitourinary:     Vulva, vagina, uterus, right adnexa and left adnexa normal.     No vulval lesion or tenderness noted.     No vaginal discharge, erythema or tenderness.     No cervical motion tenderness or polyp.     Uterus is not enlarged or tender.     No right or left adnexal mass present.     Right adnexa not tender.     Left adnexa not tender.  Neck:     Musculoskeletal: Normal range of motion.     Thyroid: No thyromegaly.  Cardiovascular:     Rate and Rhythm: Normal rate and regular rhythm.     Heart sounds: Normal heart sounds. No murmur.  Pulmonary:     Effort: Pulmonary effort is normal.     Breath sounds: Normal breath sounds.  Chest:     Breasts:        Right: No mass, nipple discharge, skin change or tenderness.        Left: No mass, nipple discharge, skin change or tenderness.  Abdominal:     Palpations:  Abdomen is soft.     Tenderness: There is no abdominal tenderness. There is no guarding.  Musculoskeletal: Normal range of motion.  Neurological:     General: No focal deficit present.     Mental Status: She is alert and oriented to person, place, and time.     Cranial Nerves:  No cranial nerve deficit.  Skin:    General: Skin is warm and dry.  Psychiatric:        Mood and Affect: Mood normal.        Behavior: Behavior normal.        Thought Content: Thought content normal.        Judgment: Judgment normal.  Vitals signs reviewed.     Assessment/Plan: Encounter for annual routine gynecological examination  Cervical cancer screening - Plan: Cytology - PAP  Screening for HPV (human papillomavirus) - Plan: Cytology - PAP  Encounter for screening mammogram for malignant neoplasm of breast - Plan: MM 3D SCREEN BREAST BILATERAL; pt to sched mammo  Prescription refill - Plan: fluconazole (DIFLUCAN) 150 MG tablet; Rx RF prn yeast vag due to abx for HS  Meds ordered this encounter  Medications  . fluconazole (DIFLUCAN) 150 MG tablet    Sig: Take 1 tablet (150 mg total) by mouth once for 1 dose.    Dispense:  1 tablet    Refill:  1    Order Specific Question:   Supervising Provider    Answer:   Gae Dry [id number]            GYN counsel breast self exam, mammography screening, adequate intake of calcium and vitamin D, diet and exercise     F/U  Return in about 1 year (around 09/29/2020).  Alicia B. Copland, PA-C 09/30/2019 8:51 AM

## 2019-09-30 ENCOUNTER — Other Ambulatory Visit: Payer: Self-pay

## 2019-09-30 ENCOUNTER — Other Ambulatory Visit (HOSPITAL_COMMUNITY)
Admission: RE | Admit: 2019-09-30 | Discharge: 2019-09-30 | Disposition: A | Discharge status: Home / Self Care | Payer: 59 | Source: Ambulatory Visit | Attending: Obstetrics and Gynecology | Admitting: Obstetrics and Gynecology

## 2019-09-30 ENCOUNTER — Ambulatory Visit (INDEPENDENT_AMBULATORY_CARE_PROVIDER_SITE_OTHER): Payer: 59 | Admitting: Obstetrics and Gynecology

## 2019-09-30 ENCOUNTER — Encounter: Payer: Self-pay | Admitting: Obstetrics and Gynecology

## 2019-09-30 VITALS — BP 110/80 | Ht 62.0 in | Wt 177.0 lb

## 2019-09-30 DIAGNOSIS — Z1151 Encounter for screening for human papillomavirus (HPV): Secondary | ICD-10-CM | POA: Insufficient documentation

## 2019-09-30 DIAGNOSIS — Z760 Encounter for issue of repeat prescription: Secondary | ICD-10-CM

## 2019-09-30 DIAGNOSIS — Z124 Encounter for screening for malignant neoplasm of cervix: Secondary | ICD-10-CM | POA: Insufficient documentation

## 2019-09-30 DIAGNOSIS — Z01419 Encounter for gynecological examination (general) (routine) without abnormal findings: Secondary | ICD-10-CM

## 2019-09-30 DIAGNOSIS — Z1231 Encounter for screening mammogram for malignant neoplasm of breast: Secondary | ICD-10-CM

## 2019-09-30 MED ORDER — FLUCONAZOLE 150 MG PO TABS: 150.0000 mg | ORAL_TABLET | Freq: Once | ORAL | 1 refills | Status: AC

## 2019-09-30 NOTE — Patient Instructions (Signed)
I value your feedback and entrusting us with your care. If you get a  patient survey, I would appreciate you taking the time to let us know about your experience today. Thank you!  [facility]: [phone]  [facility]: [phone]  

## 2019-10-12 LAB — CYTOLOGY - PAP
Comment: NEGATIVE
Diagnosis: NEGATIVE
High risk HPV: NEGATIVE

## 2019-10-14 ENCOUNTER — Ambulatory Visit
Admission: RE | Admit: 2019-10-14 | Discharge: 2019-10-14 | Disposition: A | Discharge status: Home / Self Care | Payer: 59 | Source: Ambulatory Visit | Attending: Obstetrics and Gynecology | Admitting: Obstetrics and Gynecology

## 2019-10-14 ENCOUNTER — Other Ambulatory Visit: Payer: Self-pay

## 2019-10-14 DIAGNOSIS — Z1231 Encounter for screening mammogram for malignant neoplasm of breast: Secondary | ICD-10-CM | POA: Insufficient documentation

## 2019-10-20 ENCOUNTER — Encounter: Payer: Self-pay | Admitting: Obstetrics and Gynecology

## 2019-12-15 DIAGNOSIS — Z20828 Contact with and (suspected) exposure to other viral communicable diseases: Secondary | ICD-10-CM | POA: Diagnosis not present

## 2020-02-08 ENCOUNTER — Other Ambulatory Visit: Payer: Self-pay

## 2020-02-08 ENCOUNTER — Ambulatory Visit
Admission: EM | Admit: 2020-02-08 | Discharge: 2020-02-08 | Disposition: A | Discharge status: Home / Self Care | Payer: 59

## 2020-02-08 ENCOUNTER — Encounter: Payer: Self-pay | Admitting: Emergency Medicine

## 2020-02-08 DIAGNOSIS — T887XXA Unspecified adverse effect of drug or medicament, initial encounter: Secondary | ICD-10-CM

## 2020-02-08 DIAGNOSIS — T50Z95A Adverse effect of other vaccines and biological substances, initial encounter: Secondary | ICD-10-CM | POA: Diagnosis not present

## 2020-02-08 NOTE — Discharge Instructions (Addendum)
Apply warm compresses to the area.  Move your arm muscle.  Take Tylenol or ibuprofen as needed for discomfort.  Follow up with [facility].    Follow up with your primary care provider if your symptoms are not improving.    Go to the emergency department if you have acute worsening symptoms or develop new symptoms such as difficulty swallowing or breathing.

## 2020-02-08 NOTE — ED Triage Notes (Signed)
Patient in today c/o right arm/shoulder pain and swelling x 2 days. Patient did receive her 2nd Covid vaccine in the right deltoid on Thursday (02/04/20). Patient has been icing the area without relief.

## 2020-02-08 NOTE — ED Provider Notes (Signed)
[name]    CSN: [id number] Arrival date & time: 02/08/20  1133      History   Chief Complaint Chief Complaint  Patient presents with  . Arm Pain    right  . Shoulder Pain    HPI Megan Hunter is a 44 y.o. female.   Patient presents with right upper arm and shoulder pain x2 days.  She attributes this to receiving her second COVID vaccine on 02/04/2020.  She states the area is warm, swollen, tender, red.  She has been treating her symptoms at home with ice packs.  Patient denies fever, chills, difficulty swallowing, difficulty breathing, rash, or other symptoms.  The history is provided by the patient.    Past Medical History:  Diagnosis Date  . Allergy   . Hidradenitis suppurativa     Patient Active Problem List   Diagnosis Date Noted  . Obesity (BMI 35.0-39.9 without comorbidity) 03/05/2018  . Allergic rhinitis 03/05/2018  . Hidradenitis suppurativa 04/15/2017  . Vulvar abscess 02/27/2017    Past Surgical History:  Procedure Laterality Date  . CESAREAN SECTION  2010  . NECK SURGERY     pt sattes she had a bug bite on her neck as a child that she had to have surgery for    OB History    Gravida  2   Para  2   Term  2   Preterm      AB      Living  2     SAB      TAB      Ectopic      Multiple      Live Births  2            Home Medications    Prior to Admission medications   Medication Sig Start Date End Date Taking? Authorizing Provider  clindamycin (CLEOCIN T) 1 % external solution  06/06/18  Yes [provider]  Clindamycin Phosphate foam  05/27/19  Yes [provider]  loratadine (CLARITIN) 10 MG tablet Take 10 mg by mouth daily as needed for allergies.   Yes [provider]  Multiple Vitamin (MULTIVITAMIN) tablet Take 1 tablet by mouth daily.   Yes [provider]  doxycycline (MONODOX) 100 MG capsule  06/06/18   [provider]  fluticasone (FLONASE) 50 MCG/ACT nasal spray  Place 1 spray into both nostrils daily.    [provider]  guaiFENesin (MUCINEX) 600 MG 12 hr tablet Take 600 mg by mouth 2 (two) times daily as needed.    [provider]  spironolactone (ALDACTONE) 25 MG tablet  06/24/19   [provider]    Family History Family History  Problem Relation Age of Onset  . COPD Mother   . Hyperlipidemia Father   . Diabetes Father        pre-diabetic  . Cancer Maternal Grandfather        lung  . Stroke Paternal Grandfather   . Breast cancer Neg Hx     Social History Social History   Tobacco Use  . Smoking status: Never Smoker  . Smokeless tobacco: Never Used  Substance Use Topics  . Alcohol use: Yes    Comment: occasional  . Drug use: No     Allergies   Amoxicillin and Latex   Review of Systems Review of Systems  Constitutional: Negative for chills and fever.  HENT: Negative for ear pain, sore throat and trouble swallowing.   Eyes: Negative  for pain and visual disturbance.  Respiratory: Negative for cough and shortness of breath.   Cardiovascular: Negative for chest pain and palpitations.  Gastrointestinal: Negative for abdominal pain and vomiting.  Genitourinary: Negative for dysuria and hematuria.  Musculoskeletal: Negative for arthralgias and back pain.  Skin: Positive for color change. Negative for rash.  Neurological: Negative for seizures, syncope, weakness and numbness.  All other systems reviewed and are negative.    Physical Exam Triage Vital Signs ED Triage Vitals  Enc Vitals Group     BP      Pulse      Resp      Temp      Temp src      SpO2      Weight      Height      Head Circumference      Peak Flow      Pain Score      Pain Loc      Pain Edu?      Excl. in [place]?    No data found.  Updated Vital Signs BP (!) 150/99 (BP Location: Left Arm)   Pulse (!) 105   Temp 98.2 F (36.8 C) (Oral)   Resp 18   Ht 5\' 2"  (1.575 m)   Wt 170 lb (77.1 kg)   LMP 01/19/2020 (Approximate)    SpO2 98%   BMI 31.09 kg/m   Visual Acuity Right Eye Distance:   Left Eye Distance:   Bilateral Distance:    Right Eye Near:   Left Eye Near:    Bilateral Near:     Physical Exam Vitals and nursing note reviewed.  Constitutional:      General: She is not in acute distress.    Appearance: She is well-developed. She is not ill-appearing.  HENT:     Head: Normocephalic and atraumatic.     Mouth/Throat:     Mouth: Mucous membranes are moist.     Pharynx: Oropharynx is clear.  Eyes:     Conjunctiva/sclera: Conjunctivae normal.  Cardiovascular:     Rate and Rhythm: Normal rate and regular rhythm.     Heart sounds: No murmur.  Pulmonary:     Effort: Pulmonary effort is normal. No respiratory distress.     Breath sounds: Normal breath sounds. No wheezing or rhonchi.  Abdominal:     Palpations: Abdomen is soft.     Tenderness: There is no abdominal tenderness. There is no guarding or rebound.  Musculoskeletal:        General: Tenderness present. Normal range of motion.     Cervical back: Neck supple.     Comments: Mild right deltoid and trapezius muscle tenderness.  Skin:    General: Skin is warm and dry.     Capillary Refill: Capillary refill takes less than 2 seconds.     Comments: Right upper arm: mild localized erythema and warmth.  Neurological:     General: No focal deficit present.     Mental Status: She is alert and oriented to person, place, and time.     Sensory: No sensory deficit.     Motor: No weakness.  Psychiatric:        Mood and Affect: Mood normal.        Behavior: Behavior normal.      UC Treatments / Results  Labs (all labs ordered are listed, but only abnormal results are displayed) Labs Reviewed - No data to display  EKG   Radiology No results  found.  Procedures Procedures (including critical care time)  Medications Ordered in UC Medications - No data to display  Initial Impression / Assessment and Plan / UC Course  I have  reviewed the triage vital signs and the nursing notes.  Pertinent labs & imaging results that were available during my care of the patient were reviewed by me and considered in my medical decision making (see chart for details).    Mildly localized vaccine reaction.  Instructed patient to apply warm compresses to the area and to move her muscle through range of motion exercises.  Instructed her to take Tylenol or ibuprofen as needed for the discomfort.  Instructed her to follow-up with [facility].  Instructed her to follow-up with her PCP if her symptoms or not improving.  Discussed that she should go to the ED if she has acute worsening symptoms or new symptoms such as difficulty swallowing or breathing.  Patient agrees to plan of care.     Final Clinical Impressions(s) / UC Diagnoses   Final diagnoses:  Vaccine reaction, initial encounter     Discharge Instructions     Apply warm compresses to the area.  Move your arm muscle.  Take Tylenol or ibuprofen as needed for discomfort.  Follow up with [facility].    Follow up with your primary care provider if your symptoms are not improving.    Go to the emergency department if you have acute worsening symptoms or develop new symptoms such as difficulty swallowing or breathing.          ED Prescriptions    None     I have reviewed the PDMP during this encounter.   Sharion Balloon, NP 02/08/20 [phone]

## 2020-07-25 ENCOUNTER — Ambulatory Visit (INDEPENDENT_AMBULATORY_CARE_PROVIDER_SITE_OTHER): Payer: 59 | Admitting: Family Medicine

## 2020-07-25 ENCOUNTER — Encounter: Payer: Self-pay | Admitting: Family Medicine

## 2020-07-25 VITALS — BP 131/94 | HR 99 | Temp 98.1°F | Wt 179.0 lb

## 2020-07-25 DIAGNOSIS — Z0000 Encounter for general adult medical examination without abnormal findings: Secondary | ICD-10-CM

## 2020-07-25 DIAGNOSIS — L989 Disorder of the skin and subcutaneous tissue, unspecified: Secondary | ICD-10-CM | POA: Diagnosis not present

## 2020-07-25 DIAGNOSIS — Z136 Encounter for screening for cardiovascular disorders: Secondary | ICD-10-CM | POA: Diagnosis not present

## 2020-07-25 MED ORDER — TRIAMCINOLONE ACETONIDE 0.1 % EX CREA: 1.0000 "application " | TOPICAL_CREAM | Freq: Two times a day (BID) | CUTANEOUS | 0 refills | Status: DC

## 2020-07-25 NOTE — Progress Notes (Signed)
BP (!) 131/94    Pulse 99    Temp 98.1 F (36.7 C) (Oral)    Wt 179 lb (81.2 kg)    SpO2 96%    BMI 32.74 kg/m    Subjective:    Patient ID: Megan Hunter, female    DOB: May 14, 1976, 44 y.o.   MRN: [id number]  HPI: Megan Hunter is a 44 y.o. female presenting on 07/25/2020 for comprehensive medical examination. Current medical complaints include:see below  Mosquito bite behind right knee continues to be red, swollen and tender/itchy. Not putting anything on it, has been present for several days now. Other bites healing as usual. Hx of hidradenitis so gets concerned when something doesn't heal correctly. Denies fever, chills, CP, SOB.   She currently lives with: Menopausal Symptoms: no  Depression Screen done today and results listed below:  Depression screen [facility] Iv 2/9 07/25/2020 03/21/2018 03/20/2018 03/03/2018  Decreased Interest 0 0 0 0  Down, Depressed, Hopeless 0 0 0 0  PHQ - 2 Score 0 0 0 0  Altered sleeping 0 1 - 1  Tired, decreased energy 0 0 - 0  Change in appetite 0 0 - 1  Feeling bad or failure about yourself  0 0 - 0  Trouble concentrating 0 0 - 0  Moving slowly or fidgety/restless 0 0 - 0  Suicidal thoughts 0 0 - 0  PHQ-9 Score 0 1 - 2    The patient does not have a history of falls. I did complete a risk assessment for falls. A plan of care for falls was documented.   Past Medical History:  Past Medical History:  Diagnosis Date   Allergy    Hidradenitis suppurativa     Surgical History:  Past Surgical History:  Procedure Laterality Date   CESAREAN SECTION  2010   NECK SURGERY     pt sattes she had a bug bite on her neck as a child that she had to have surgery for    Medications:  Current Outpatient Medications on File Prior to Visit  Medication Sig   fluticasone (FLONASE) 50 MCG/ACT nasal spray Place 1 spray into both nostrils daily.   guaiFENesin (MUCINEX) 600 MG 12 hr tablet Take 600 mg by mouth 2 (two) times daily as needed.   loratadine (CLARITIN)  10 MG tablet Take 10 mg by mouth daily as needed for allergies.   Multiple Vitamin (MULTIVITAMIN) tablet Take 1 tablet by mouth daily.   No current facility-administered medications on file prior to visit.    Allergies:  Allergies  Allergen Reactions   Amoxicillin Hives   Latex Itching and Rash    Social History:  Social History   Socioeconomic History   Marital status: Married    Spouse name: Not on file   Number of children: Not on file   Years of education: Not on file   Highest education level: Not on file  Occupational History   Not on file  Tobacco Use   Smoking status: Never Smoker   Smokeless tobacco: Never Used  Vaping Use   Vaping Use: Never used  Substance and Sexual Activity   Alcohol use: Yes    Comment: occasional   Drug use: No   Sexual activity: Yes    Birth control/protection: Condom  Other Topics Concern   Not on file  Social History Narrative   Not on file   Social Determinants of Health   Financial Resource Strain:    Difficulty of Paying Living Expenses:  Food Insecurity:    Worried About Charity fundraiser in the Last Year:    Arboriculturist in the Last Year:   Transportation Needs:    Film/video editor (Medical):    Lack of Transportation (Non-Medical):   Physical Activity:    Days of Exercise per Week:    Minutes of Exercise per Session:   Stress:    Feeling of Stress :   Social Connections:    Frequency of Communication with Friends and Family:    Frequency of Social Gatherings with Friends and Family:    Attends Religious Services:    Active Member of Clubs or Organizations:    Attends Music therapist:    Marital Status:   Intimate Partner Violence:    Fear of Current or Ex-Partner:    Emotionally Abused:    Physically Abused:    Sexually Abused:    Social History   Tobacco Use  Smoking Status Never Smoker  Smokeless Tobacco Never Used   Social History    Substance and Sexual Activity  Alcohol Use Yes   Comment: occasional    Family History:  Family History  Problem Relation Age of Onset   COPD Mother    Hyperlipidemia Father    Diabetes Father        pre-diabetic   Cancer Maternal Grandfather        lung   Stroke Paternal Grandfather    Breast cancer Neg Hx     Past medical history, surgical history, medications, allergies, family history and social history reviewed with patient today and changes made to appropriate areas of the chart.   Review of Systems - General ROS: negative Psychological ROS: negative Ophthalmic ROS: negative ENT ROS: negative Allergy and Immunology ROS: negative Hematological and Lymphatic ROS: negative Endocrine ROS: negative Breast ROS: negative for breast lumps Respiratory ROS: no cough, shortness of breath, or wheezing Cardiovascular ROS: no chest pain or dyspnea on exertion Gastrointestinal ROS: no abdominal pain, change in bowel habits, or black or bloody stools Genito-Urinary ROS: no dysuria, trouble voiding, or hematuria Musculoskeletal ROS: negative Neurological ROS: no TIA or stroke symptoms Dermatological ROS: positive for skin lesion changes All other ROS negative except what is listed above and in the HPI.      Objective:    BP (!) 131/94    Pulse 99    Temp 98.1 F (36.7 C) (Oral)    Wt 179 lb (81.2 kg)    SpO2 96%    BMI 32.74 kg/m   Wt Readings from Last 3 Encounters:  07/25/20 179 lb (81.2 kg)  02/08/20 170 lb (77.1 kg)  09/30/19 177 lb (80.3 kg)    Physical Exam Vitals and nursing note reviewed.  Constitutional:      General: She is not in acute distress.    Appearance: She is well-developed.  HENT:     Head: Atraumatic.     Right Ear: External ear normal.     Left Ear: External ear normal.     Nose: Nose normal.     Mouth/Throat:     Pharynx: No oropharyngeal exudate.  Eyes:     General: No scleral icterus.    Conjunctiva/sclera: Conjunctivae normal.      Pupils: Pupils are equal, round, and reactive to light.  Neck:     Thyroid: No thyromegaly.  Cardiovascular:     Rate and Rhythm: Normal rate and regular rhythm.     Heart sounds: Normal heart sounds.  Pulmonary:     Effort: Pulmonary effort is normal. No respiratory distress.     Breath sounds: Normal breath sounds.  Chest:     Comments: Breast exam done through GYN Abdominal:     General: Bowel sounds are normal.     Palpations: Abdomen is soft. There is no mass.     Tenderness: There is no abdominal tenderness.  Genitourinary:    Comments: GU exam done through GYN Musculoskeletal:        General: No tenderness. Normal range of motion.     Cervical back: Normal range of motion and neck supple.  Lymphadenopathy:     Cervical: No cervical adenopathy.  Skin:    General: Skin is warm and dry.     Findings: No rash.     Comments: Multiple erythematous papules right posterior leg, one posterior to knee more inflamed and tender than rest. Non fluctuant, no drainage or abscess noted. No streaking  Neurological:     Mental Status: She is alert and oriented to person, place, and time.     Cranial Nerves: No cranial nerve deficit.  Psychiatric:        Behavior: Behavior normal.     Results for orders placed or performed in visit on 09/30/19  Cytology - PAP  Result Value Ref Range   High risk HPV Negative    Adequacy      Satisfactory for evaluation; transformation zone component PRESENT.   Diagnosis      - Negative for intraepithelial lesion or malignancy (NILM)   Comment Normal Reference Range HPV - Negative       Assessment & Plan:   Problem List Items Addressed This Visit    None    Visit Diagnoses    Annual physical exam    -  Primary   Future labs placed, pt to return in near future for routine blood and urine testing   Relevant Orders   CBC with Differential/Platelet   Comprehensive metabolic panel   TSH   UA/M w/rflx Culture, Routine   Screening for  cardiovascular condition       Relevant Orders   Lipid Panel w/o Chol/HDL Ratio   Skin lesion       persistent erythema and inflammation of insect bite posterior right knee. Tx with triamcinolone, neosporin, keep clean and dry. Abx if worsening       Follow up plan: Return in about 1 year (around 07/25/2021) for CPE.   LABORATORY TESTING:  - Pap smear: done through GYN  IMMUNIZATIONS:   - Tdap: Tetanus vaccination status reviewed: last tetanus booster within 10 years.- awaiting records on when/where administered - Influenza: Up to date  SCREENING: -Mammogram: Up to date   PATIENT COUNSELING:   Advised to take 1 mg of folate supplement per day if capable of pregnancy.   Sexuality: Discussed sexually transmitted diseases, partner selection, use of condoms, avoidance of unintended pregnancy  and contraceptive alternatives.   Advised to avoid cigarette smoking.  I discussed with the patient that most people either abstain from alcohol or drink within safe limits (<=14/week and <=4 drinks/occasion for males, <=7/weeks and <= 3 drinks/occasion for females) and that the risk for alcohol disorders and other health effects rises proportionally with the number of drinks per week and how often a drinker exceeds daily limits.  Discussed cessation/primary prevention of drug use and availability of treatment for abuse.   Diet: Encouraged to adjust caloric intake to maintain  or achieve ideal body weight,  to reduce intake of dietary saturated fat and total fat, to limit sodium intake by avoiding high sodium foods and not adding table salt, and to maintain adequate dietary potassium and calcium preferably from fresh fruits, vegetables, and low-fat dairy products.    stressed the importance of regular exercise  Injury prevention: Discussed safety belts, safety helmets, smoke detector, smoking near bedding or upholstery.   Dental health: Discussed importance of regular tooth brushing, flossing,  and dental visits.    NEXT PREVENTATIVE PHYSICAL DUE IN 1 YEAR. Return in about 1 year (around 07/25/2021) for CPE.

## 2020-08-01 ENCOUNTER — Other Ambulatory Visit: Payer: 59

## 2020-08-01 ENCOUNTER — Other Ambulatory Visit: Payer: Self-pay

## 2020-08-01 DIAGNOSIS — Z136 Encounter for screening for cardiovascular disorders: Secondary | ICD-10-CM | POA: Diagnosis not present

## 2020-08-01 DIAGNOSIS — Z0000 Encounter for general adult medical examination without abnormal findings: Secondary | ICD-10-CM

## 2020-08-01 LAB — UA/M W/RFLX CULTURE, ROUTINE
Bilirubin, UA: NEGATIVE
Glucose, UA: NEGATIVE
Ketones, UA: NEGATIVE
Leukocytes,UA: NEGATIVE
Nitrite, UA: NEGATIVE
Protein,UA: NEGATIVE
RBC, UA: NEGATIVE
Specific Gravity, UA: 1.015 (ref 1.005–1.030)
Urobilinogen, Ur: 0.2 mg/dL (ref 0.2–1.0)
pH, UA: 7 (ref 5.0–7.5)

## 2020-08-02 LAB — CBC WITH DIFFERENTIAL/PLATELET
Basophils Absolute: 0 10*3/uL (ref 0.0–0.2)
Basos: 1 %
EOS (ABSOLUTE): 0.1 10*3/uL (ref 0.0–0.4)
Eos: 2 %
Hematocrit: 39.8 % (ref 34.0–46.6)
Hemoglobin: 13 g/dL (ref 11.1–15.9)
Immature Grans (Abs): 0 10*3/uL (ref 0.0–0.1)
Immature Granulocytes: 1 %
Lymphocytes Absolute: 2.4 10*3/uL (ref 0.7–3.1)
Lymphs: 36 %
MCH: 28.6 pg (ref 26.6–33.0)
MCHC: 32.7 g/dL (ref 31.5–35.7)
MCV: 88 fL (ref 79–97)
Monocytes Absolute: 0.4 10*3/uL (ref 0.1–0.9)
Monocytes: 6 %
Neutrophils Absolute: 3.6 10*3/uL (ref 1.4–7.0)
Neutrophils: 54 %
Platelets: 230 10*3/uL (ref 150–450)
RBC: 4.54 x10E6/uL (ref 3.77–5.28)
RDW: 12.8 % (ref 11.7–15.4)
WBC: 6.6 10*3/uL (ref 3.4–10.8)

## 2020-08-02 LAB — COMPREHENSIVE METABOLIC PANEL
ALT: 11 IU/L (ref 0–32)
AST: 13 IU/L (ref 0–40)
Albumin/Globulin Ratio: 1.7 (ref 1.2–2.2)
Albumin: 4.3 g/dL (ref 3.8–4.8)
Alkaline Phosphatase: 34 IU/L — ABNORMAL LOW (ref 48–121)
BUN/Creatinine Ratio: 28 — ABNORMAL HIGH (ref 9–23)
BUN: 18 mg/dL (ref 6–24)
Bilirubin Total: 0.3 mg/dL (ref 0.0–1.2)
CO2: 23 mmol/L (ref 20–29)
Calcium: 9.4 mg/dL (ref 8.7–10.2)
Chloride: 103 mmol/L (ref 96–106)
Creatinine, Ser: 0.65 mg/dL (ref 0.57–1.00)
GFR calc Af Amer: 127 mL/min/{1.73_m2} (ref >59)
GFR calc non Af Amer: 110 mL/min/{1.73_m2} (ref >59)
Globulin, Total: 2.6 g/dL (ref 1.5–4.5)
Glucose: 88 mg/dL (ref 65–99)
Potassium: 4.6 mmol/L (ref 3.5–5.2)
Sodium: 138 mmol/L (ref 134–144)
Total Protein: 6.9 g/dL (ref 6.0–8.5)

## 2020-08-02 LAB — LIPID PANEL W/O CHOL/HDL RATIO
Cholesterol, Total: 222 mg/dL — ABNORMAL HIGH (ref 100–199)
HDL: 65 mg/dL (ref >39)
LDL Chol Calc (NIH): 146 mg/dL — ABNORMAL HIGH (ref 0–99)
Triglycerides: 64 mg/dL (ref 0–149)
VLDL Cholesterol Cal: 11 mg/dL (ref 5–40)

## 2020-08-02 LAB — TSH: TSH: 2.24 u[IU]/mL (ref 0.450–4.500)

## 2020-10-03 ENCOUNTER — Ambulatory Visit: Payer: 59 | Admitting: Obstetrics and Gynecology

## 2020-10-04 ENCOUNTER — Other Ambulatory Visit: Payer: Self-pay

## 2020-10-04 ENCOUNTER — Ambulatory Visit (INDEPENDENT_AMBULATORY_CARE_PROVIDER_SITE_OTHER): Payer: 59 | Admitting: Obstetrics and Gynecology

## 2020-10-04 ENCOUNTER — Encounter: Payer: Self-pay | Admitting: Obstetrics and Gynecology

## 2020-10-04 VITALS — BP 104/70 | Ht 61.0 in | Wt 179.0 lb

## 2020-10-04 DIAGNOSIS — Z01419 Encounter for gynecological examination (general) (routine) without abnormal findings: Secondary | ICD-10-CM

## 2020-10-04 DIAGNOSIS — Z1231 Encounter for screening mammogram for malignant neoplasm of breast: Secondary | ICD-10-CM | POA: Diagnosis not present

## 2020-10-04 NOTE — Patient Instructions (Signed)
I value your feedback and entrusting us with your care. If you get a  patient survey, I would appreciate you taking the time to let us know about your experience today. Thank you! ° °As of December 03, 2019, your lab results will be released to your MyChart immediately, before I even have a chance to see them. Please give me time to review them and contact you if there are any abnormalities. Thank you for your patience.  ° °[facility]: [phone] ° ° ° °

## 2020-10-04 NOTE — Progress Notes (Signed)
PCP:  Volney American, PA-C   Chief Complaint  Patient presents with  . Gynecologic Exam     HPI:      [name] is a 44 y.o. No obstetric history on file. who LMP was Patient's last menstrual period was 09/26/2020 (exact date)., presents today for her annual examination.  Her menses are regular every 28-30 days, lasting 3-4 days, mod flow.  Dysmenorrhea mild, occurring first 1-2 days of flow. She does not have intermenstrual bleeding.  Sex activity: single partner, contraception - condoms, declines other BC Last Pap: 09/30/19  Results were: no abnormalities /neg HPV DNA  Hx of STDs: none  Last mammo: 10/14/19  Results were negative, repeat in 12 months There is no FH of breast cancer. There is no FH of ovarian cancer. The patient does do self-breast exams.  Tobacco use: The patient denies current or previous tobacco use. Alcohol use: social drinker No drug use.  Exercise: moderately active  She does get adequate calcium but not Vitamin D in her diet. Labs with PCP.  Hx of hidradenitis suppurativa, followed by derm. Sx worse in summer. Has foam to use, abx prn.   Past Medical History:  Diagnosis Date  . Allergy   . Hidradenitis suppurativa     Past Surgical History:  Procedure Laterality Date  . CESAREAN SECTION  2010  . NECK SURGERY     pt sattes she had a bug bite on her neck as a child that she had to have surgery for    Family History  Problem Relation Age of Onset  . COPD Mother   . Hyperlipidemia Father   . Diabetes Father        pre-diabetic  . Cancer Maternal Grandfather        lung  . Stroke Paternal Grandfather   . Breast cancer Neg Hx     Social History   Socioeconomic History  . Marital status: Married    Spouse name: Not on file  . Number of children: Not on file  . Years of education: Not on file  . Highest education level: Not on file  Occupational History  . Not on file  Tobacco Use  . Smoking status: Never Smoker  .  Smokeless tobacco: Never Used  Vaping Use  . Vaping Use: Never used  Substance and Sexual Activity  . Alcohol use: Yes    Comment: occasional  . Drug use: No  . Sexual activity: Yes    Birth control/protection: Condom  Other Topics Concern  . Not on file  Social History Narrative  . Not on file   Social Determinants of Health   Financial Resource Strain:   . Difficulty of Paying Living Expenses: Not on file  Food Insecurity:   . Worried About Charity fundraiser in the Last Year: Not on file  . Ran Out of Food in the Last Year: Not on file  Transportation Needs:   . Lack of Transportation (Medical): Not on file  . Lack of Transportation (Non-Medical): Not on file  Physical Activity:   . Days of Exercise per Week: Not on file  . Minutes of Exercise per Session: Not on file  Stress:   . Feeling of Stress : Not on file  Social Connections:   . Frequency of Communication with Friends and Family: Not on file  . Frequency of Social Gatherings with Friends and Family: Not on file  . Attends Religious Services: Not on file  .  Active Member of Clubs or Organizations: Not on file  . Attends Archivist Meetings: Not on file  . Marital Status: Not on file  Intimate Partner Violence:   . Fear of Current or Ex-Partner: Not on file  . Emotionally Abused: Not on file  . Physically Abused: Not on file  . Sexually Abused: Not on file    Outpatient Medications Prior to Visit  Medication Sig Dispense Refill  . fluticasone (FLONASE) 50 MCG/ACT nasal spray Place 1 spray into both nostrils daily.    Marland Kitchen guaiFENesin (MUCINEX) 600 MG 12 hr tablet Take 600 mg by mouth 2 (two) times daily as needed.    . loratadine (CLARITIN) 10 MG tablet Take 10 mg by mouth daily as needed for allergies.    . Multiple Vitamin (MULTIVITAMIN) tablet Take 1 tablet by mouth daily.    Marland Kitchen triamcinolone cream (KENALOG) 0.1 % Apply 1 application topically 2 (two) times daily. For insect bite right leg 30 g 0    No facility-administered medications prior to visit.      ROS:  Review of Systems  Constitutional: Negative for fatigue, fever and unexpected weight change.  Respiratory: Negative for cough, shortness of breath and wheezing.   Cardiovascular: Negative for chest pain, palpitations and leg swelling.  Gastrointestinal: Negative for blood in stool, constipation, diarrhea, nausea and vomiting.  Endocrine: Negative for cold intolerance, heat intolerance and polyuria.  Genitourinary: Negative for dyspareunia, dysuria, flank pain, frequency, genital sores, hematuria, menstrual problem, pelvic pain, urgency, vaginal bleeding, vaginal discharge and vaginal pain.  Musculoskeletal: Negative for back pain, joint swelling and myalgias.  Skin: Negative for rash and wound.  Neurological: Negative for dizziness, syncope, light-headedness, numbness and headaches.  Hematological: Negative for adenopathy.  Psychiatric/Behavioral: Negative for agitation, confusion, sleep disturbance and suicidal ideas. The patient is not nervous/anxious.   BREAST: No symptoms   Objective: BP 104/70   Ht 5\' 1"  (1.549 m)   Wt 179 lb (81.2 kg)   LMP 09/26/2020 (Exact Date)   BMI 33.82 kg/m    Physical Exam Constitutional:      Appearance: She is well-developed.  Genitourinary:     Vulva, vagina, uterus, right adnexa and left adnexa normal.     No vulval lesion or tenderness noted.     No vaginal discharge, erythema or tenderness.     No cervical motion tenderness or polyp.     Uterus is not enlarged or tender.     No right or left adnexal mass present.     Right adnexa not tender.     Left adnexa not tender.  Neck:     Thyroid: No thyromegaly.  Cardiovascular:     Rate and Rhythm: Normal rate and regular rhythm.     Heart sounds: Normal heart sounds. No murmur heard.   Pulmonary:     Effort: Pulmonary effort is normal.     Breath sounds: Normal breath sounds.  Chest:     Breasts:        Right: No  mass, nipple discharge, skin change or tenderness.        Left: No mass, nipple discharge, skin change or tenderness.  Abdominal:     Palpations: Abdomen is soft.     Tenderness: There is no abdominal tenderness. There is no guarding.  Musculoskeletal:        General: Normal range of motion.     Cervical back: Normal range of motion.  Neurological:     General: No focal deficit  present.     Mental Status: She is alert and oriented to person, place, and time.     Cranial Nerves: No cranial nerve deficit.  Skin:    General: Skin is warm and dry.  Psychiatric:        Mood and Affect: Mood normal.        Behavior: Behavior normal.        Thought Content: Thought content normal.        Judgment: Judgment normal.  Vitals reviewed.     Assessment/Plan: Encounter for annual routine gynecological examination  Encounter for screening mammogram for malignant neoplasm of breast - Plan: MM 3D SCREEN BREAST BILATERAL; pt to sched mammo          GYN counsel breast self exam, mammography screening, adequate intake of calcium and vitamin D, diet and exercise     F/U  Return in about 1 year (around 10/04/2021).  Kayci Belleville B. Jed Kutch, PA-C 10/04/2020 8:47 AM

## 2020-11-23 ENCOUNTER — Other Ambulatory Visit: Payer: Self-pay

## 2020-11-23 ENCOUNTER — Ambulatory Visit
Admission: RE | Admit: 2020-11-23 | Discharge: 2020-11-23 | Disposition: A | Discharge status: Home / Self Care | Payer: 59 | Source: Ambulatory Visit | Attending: Obstetrics and Gynecology | Admitting: Obstetrics and Gynecology

## 2020-11-23 DIAGNOSIS — Z1231 Encounter for screening mammogram for malignant neoplasm of breast: Secondary | ICD-10-CM | POA: Insufficient documentation

## 2021-03-31 DIAGNOSIS — H5213 Myopia, bilateral: Secondary | ICD-10-CM | POA: Diagnosis not present

## 2021-04-18 ENCOUNTER — Telehealth: Payer: Self-pay

## 2021-04-18 NOTE — Telephone Encounter (Signed)
Pt calling; passed large blood clots all day yesterday and almost stopped today.  [phone]

## 2021-04-18 NOTE — Telephone Encounter (Signed)
Ok. F/u if changing products every 30 min, otherwise, clots can happen. Cont to follow her cycles.

## 2021-04-19 NOTE — Telephone Encounter (Signed)
Pt aware.

## 2021-05-01 ENCOUNTER — Encounter: Payer: Self-pay | Admitting: Obstetrics and Gynecology

## 2021-05-01 ENCOUNTER — Other Ambulatory Visit: Payer: Self-pay

## 2021-05-01 ENCOUNTER — Ambulatory Visit: Payer: 59 | Admitting: Obstetrics and Gynecology

## 2021-05-01 VITALS — BP 120/80 | Ht 62.0 in | Wt 183.0 lb

## 2021-05-01 DIAGNOSIS — N920 Excessive and frequent menstruation with regular cycle: Secondary | ICD-10-CM | POA: Diagnosis not present

## 2021-05-01 NOTE — Progress Notes (Signed)
Jon Billings, NP   Chief Complaint  Patient presents with  . Menorrhagia    April cycle was abnormal, changing full pad about every 30 mins, big clots, first time ever    HPI:      Ms. Laverda Stribling is a 45 y.o. [id number] whose LMP was Patient's last menstrual period was 04/16/2021 (exact date)., presents today for heavy menses last month. Started a few days early, lasted 1 1/2 days with heavy flow the whole time, changing tampons every 30 min and with large clots (palm size). Normal dysmen/LBP, no BTB. Her menses are usually 28-30 days, lasting 3-4 days, mod flow. Dysmenorrhea mild, occurring first 1-2 days of flow. She does not have intermenstrual bleeding. No extra stress, sickness, wt change last cycle. She is sex active, using condoms every time. No UPT done.  Normal thyroid 8/21; neg pap/neg HPV DNA 10/20.   Past Medical History:  Diagnosis Date  . Allergy   . Hidradenitis suppurativa     Past Surgical History:  Procedure Laterality Date  . CESAREAN SECTION  2010  . NECK SURGERY     pt sattes she had a bug bite on her neck as a child that she had to have surgery for    Family History  Problem Relation Age of Onset  . COPD Mother   . Hyperlipidemia Father   . Diabetes Father        pre-diabetic  . Cancer Maternal Grandfather        lung  . Stroke Paternal Grandfather   . Breast cancer Neg Hx     Social History   Socioeconomic History  . Marital status: Married    Spouse name: Not on file  . Number of children: Not on file  . Years of education: Not on file  . Highest education level: Not on file  Occupational History  . Not on file  Tobacco Use  . Smoking status: Never Smoker  . Smokeless tobacco: Never Used  Vaping Use  . Vaping Use: Never used  Substance and Sexual Activity  . Alcohol use: Yes    Comment: occasional  . Drug use: No  . Sexual activity: Yes    Birth control/protection: Condom  Other Topics Concern  . Not on file  Social  History Narrative  . Not on file   Social Determinants of Health   Financial Resource Strain: Not on file  Food Insecurity: Not on file  Transportation Needs: Not on file  Physical Activity: Not on file  Stress: Not on file  Social Connections: Not on file  Intimate Partner Violence: Not on file    Outpatient Medications Prior to Visit  Medication Sig Dispense Refill  . fluticasone (FLONASE) 50 MCG/ACT nasal spray Place 1 spray into both nostrils daily.    Marland Kitchen guaiFENesin (MUCINEX) 600 MG 12 hr tablet Take 600 mg by mouth 2 (two) times daily as needed.    . loratadine (CLARITIN) 10 MG tablet Take 10 mg by mouth daily as needed for allergies.    . Multiple Vitamin (MULTIVITAMIN) tablet Take 1 tablet by mouth daily.     No facility-administered medications prior to visit.      ROS:  Review of Systems  Constitutional: Negative for fever.  Gastrointestinal: Negative for blood in stool, constipation, diarrhea, nausea and vomiting.  Genitourinary: Positive for menstrual problem. Negative for dyspareunia, dysuria, flank pain, frequency, hematuria, urgency, vaginal bleeding, vaginal discharge and vaginal pain.  Musculoskeletal: Negative for back pain.  Skin: Negative for rash.    OBJECTIVE:   Vitals:  BP 120/80   Ht 5\' 2"  (1.575 m)   Wt 183 lb (83 kg)   LMP 04/16/2021 (Exact Date)   BMI 33.47 kg/m   Physical Exam Vitals reviewed.  Constitutional:      Appearance: She is well-developed.  Pulmonary:     Effort: Pulmonary effort is normal.  Genitourinary:    General: Normal vulva.     Pubic Area: No rash.      Labia:        Right: No rash, tenderness or lesion.        Left: No rash, tenderness or lesion.      Vagina: Normal. No vaginal discharge, erythema or tenderness.     Cervix: Normal.     Uterus: Normal. Not enlarged and not tender.      Adnexa: Right adnexa normal and left adnexa normal.       Right: No mass or tenderness.         Left: No mass or tenderness.     Musculoskeletal:        General: Normal range of motion.     Cervical back: Normal range of motion.  Skin:    General: Skin is warm and dry.  Neurological:     General: No focal deficit present.     Mental Status: She is alert and oriented to person, place, and time.  Psychiatric:        Mood and Affect: Mood normal.        Behavior: Behavior normal.        Thought Content: Thought content normal.        Judgment: Judgment normal.     Assessment/Plan: Menorrhagia with regular cycle--last cycle. Since recent labs/pap normal and exam neg, see if sx recur again. If so, will check GYN u/s and labs. F/u prn.     Return if symptoms worsen or fail to improve.  Sona Nations B. Jossalyn Forgione, PA-C 05/01/2021 10:16 AM

## 2021-05-01 NOTE — Patient Instructions (Signed)
I value your feedback and you entrusting us with your care. If you get a [facility] patient survey, I would appreciate you taking the time to let us know about your experience today. Thank you! ? ? ?

## 2021-05-17 ENCOUNTER — Other Ambulatory Visit: Payer: Self-pay

## 2021-05-17 DIAGNOSIS — M25511 Pain in right shoulder: Secondary | ICD-10-CM | POA: Diagnosis not present

## 2021-05-17 MED ORDER — MELOXICAM 15 MG PO TABS
ORAL_TABLET | ORAL | 2 refills | Status: DC
  Filled 2021-05-17: qty 30, 30d supply, fill #0
  Filled 2021-07-10: qty 30, 30d supply, fill #1
  Filled 2022-01-03: qty 30, 30d supply, fill #2

## 2021-07-10 ENCOUNTER — Other Ambulatory Visit (HOSPITAL_COMMUNITY): Payer: Self-pay

## 2021-07-10 ENCOUNTER — Other Ambulatory Visit: Payer: Self-pay

## 2022-01-04 ENCOUNTER — Other Ambulatory Visit: Payer: Self-pay

## 2022-01-25 ENCOUNTER — Ambulatory Visit: Payer: 59 | Admitting: Obstetrics and Gynecology

## 2022-01-31 ENCOUNTER — Other Ambulatory Visit: Payer: Self-pay | Admitting: Obstetrics and Gynecology

## 2022-01-31 DIAGNOSIS — Z1231 Encounter for screening mammogram for malignant neoplasm of breast: Secondary | ICD-10-CM

## 2022-02-13 ENCOUNTER — Encounter: Payer: Self-pay | Admitting: Obstetrics and Gynecology

## 2022-02-13 ENCOUNTER — Ambulatory Visit (INDEPENDENT_AMBULATORY_CARE_PROVIDER_SITE_OTHER): Payer: 59 | Admitting: Obstetrics and Gynecology

## 2022-02-13 ENCOUNTER — Other Ambulatory Visit: Payer: Self-pay

## 2022-02-13 VITALS — BP 116/80 | Ht 62.0 in | Wt 188.0 lb

## 2022-02-13 DIAGNOSIS — Z30011 Encounter for initial prescription of contraceptive pills: Secondary | ICD-10-CM | POA: Diagnosis not present

## 2022-02-13 DIAGNOSIS — Z01419 Encounter for gynecological examination (general) (routine) without abnormal findings: Secondary | ICD-10-CM | POA: Diagnosis not present

## 2022-02-13 DIAGNOSIS — Z1231 Encounter for screening mammogram for malignant neoplasm of breast: Secondary | ICD-10-CM

## 2022-02-13 MED ORDER — MICROGESTIN 24 FE 1-20 MG-MCG PO TABS
1.0000 | ORAL_TABLET | Freq: Every day | ORAL | 3 refills | Status: DC
  Filled 2022-02-13 – 2022-05-15 (×2): qty 84, 84d supply, fill #0
  Filled 2022-08-10: qty 84, 84d supply, fill #1
  Filled 2022-11-05: qty 84, 84d supply, fill #2
  Filled 2023-01-28: qty 84, 84d supply, fill #3

## 2022-02-13 NOTE — Progress Notes (Signed)
PCP:  Jon Billings, NP   Chief Complaint  Patient presents with   Gynecologic Exam    No concerns     HPI:      [name] is a 46 y.o. No obstetric history on file. who LMP was Patient's last menstrual period was 01/28/2022 (approximate)., presents today for her annual examination.  Her menses are regular every 28-30 days, lasting 4 days, mod flow.  Dysmenorrhea mild, occurring first 1-2 days of flow. She does not have intermenstrual bleeding.  Sex activity: single partner, contraception - condoms, would like to restart OCPs. Did lomedia in past. No hx of HTN, DVTs, migrained with aura.  Last Pap: 09/30/19  Results were: no abnormalities /neg HPV DNA  Hx of STDs: none  Last mammo: 11/23/20  Results were negative, repeat in 12 months.  Has appt 3/23 There is no FH of breast cancer. There is no FH of ovarian cancer. The patient does do self-breast exams.  Tobacco use: The patient denies current or previous tobacco use. Alcohol use: social drinker No drug use.  Exercise: very active  She does get adequate calcium and Vitamin D in her diet. Labs with PCP.  Hx of hidradenitis suppurativa, followed by derm. Sx worse in summer. Has panoxl foam to use, abx prn. Better controlled now.  Past Medical History:  Diagnosis Date   Allergy    Hidradenitis suppurativa     Past Surgical History:  Procedure Laterality Date   CESAREAN SECTION  2010   NECK SURGERY     pt sattes she had a bug bite on her neck as a child that she had to have surgery for    Family History  Problem Relation Age of Onset   COPD Mother    Hyperlipidemia Father    Diabetes Father        pre-diabetic   Cancer Maternal Grandfather        lung   Stroke Paternal Grandfather    Breast cancer Neg Hx     Social History   Socioeconomic History   Marital status: Married    Spouse name: Not on file   Number of children: Not on file   Years of education: Not on file   Highest education level: Not  on file  Occupational History   Not on file  Tobacco Use   Smoking status: Never   Smokeless tobacco: Never  Vaping Use   Vaping Use: Never used  Substance and Sexual Activity   Alcohol use: Yes    Comment: occasional   Drug use: No   Sexual activity: Yes    Birth control/protection: Condom, None  Other Topics Concern   Not on file  Social History Narrative   Not on file   Social Determinants of Health   Financial Resource Strain: Not on file  Food Insecurity: Not on file  Transportation Needs: Not on file  Physical Activity: Not on file  Stress: Not on file  Social Connections: Not on file  Intimate Partner Violence: Not on file    Outpatient Medications Prior to Visit  Medication Sig Dispense Refill   fluticasone (FLONASE) 50 MCG/ACT nasal spray Place 1 spray into both nostrils daily.     guaiFENesin (MUCINEX) 600 MG 12 hr tablet Take 600 mg by mouth 2 (two) times daily as needed.     loratadine (CLARITIN) 10 MG tablet Take 10 mg by mouth daily as needed for allergies.     meloxicam (MOBIC) 15 MG tablet  Take 1 tablet every day by oral route. 30 tablet 2   Multiple Vitamin (MULTIVITAMIN) tablet Take 1 tablet by mouth daily.     No facility-administered medications prior to visit.      ROS:  Review of Systems  Constitutional:  Negative for fatigue, fever and unexpected weight change.  Respiratory:  Negative for cough, shortness of breath and wheezing.   Cardiovascular:  Negative for chest pain, palpitations and leg swelling.  Gastrointestinal:  Negative for blood in stool, constipation, diarrhea, nausea and vomiting.  Endocrine: Negative for cold intolerance, heat intolerance and polyuria.  Genitourinary:  Negative for dyspareunia, dysuria, flank pain, frequency, genital sores, hematuria, menstrual problem, pelvic pain, urgency, vaginal bleeding, vaginal discharge and vaginal pain.  Musculoskeletal:  Negative for back pain, joint swelling and myalgias.  Skin:   Negative for rash and wound.  Neurological:  Negative for dizziness, syncope, light-headedness, numbness and headaches.  Hematological:  Negative for adenopathy.  Psychiatric/Behavioral:  Negative for agitation, confusion, sleep disturbance and suicidal ideas. The patient is not nervous/anxious.  BREAST: No symptoms   Objective: BP 116/80    Ht 5\' 2"  (1.575 m)    Wt 188 lb (85.3 kg)    LMP 01/28/2022 (Approximate)    BMI 34.39 kg/m    Physical Exam Constitutional:      Appearance: She is well-developed.  Genitourinary:     Vulva normal.     Right Labia: No rash, tenderness or lesions.    Left Labia: No tenderness, lesions or rash.    No vaginal discharge, erythema or tenderness.      Right Adnexa: not tender and no mass present.    Left Adnexa: not tender and no mass present.    No cervical motion tenderness, friability or polyp.     Uterus is not enlarged or tender.  Breasts:    Right: No mass, nipple discharge, skin change or tenderness.     Left: No mass, nipple discharge, skin change or tenderness.  Neck:     Thyroid: No thyromegaly.  Cardiovascular:     Rate and Rhythm: Normal rate and regular rhythm.     Heart sounds: Normal heart sounds. No murmur heard. Pulmonary:     Effort: Pulmonary effort is normal.     Breath sounds: Normal breath sounds.  Abdominal:     Palpations: Abdomen is soft.     Tenderness: There is no abdominal tenderness. There is no guarding or rebound.  Musculoskeletal:        General: Normal range of motion.     Cervical back: Normal range of motion.  Lymphadenopathy:     Cervical: No cervical adenopathy.  Neurological:     General: No focal deficit present.     Mental Status: She is alert and oriented to person, place, and time.     Cranial Nerves: No cranial nerve deficit.  Skin:    General: Skin is warm and dry.  Psychiatric:        Mood and Affect: Mood normal.        Behavior: Behavior normal.        Thought Content: Thought content  normal.        Judgment: Judgment normal.  Vitals reviewed.    Assessment/Plan: Encounter for annual routine gynecological examination  Encounter for initial prescription of contraceptive pills - Plan: Norethindrone Acetate-Ethinyl Estrad-FE (MICROGESTIN 24 FE) 1-20 MG-MCG(24) tablet; OCP Rx eRxd. Start with next menses. F/u prn.   Encounter for screening mammogram for malignant neoplasm of  breast; pt has appt.  Meds ordered this encounter  Medications   Norethindrone Acetate-Ethinyl Estrad-FE (MICROGESTIN 24 FE) 1-20 MG-MCG(24) tablet    Sig: Take 1 tablet by mouth daily.    Dispense:  84 tablet    Refill:  3    Order Specific Question:   Supervising Provider    Answer:   Gae Dry [[id number]]           GYN counsel breast self exam, mammography screening, adequate intake of calcium and vitamin D, diet and exercise     F/U  Return in about 1 year (around 02/13/2023).  Megan Schoenbeck B. Jeanee Fabre, PA-C 02/13/2022 1:28 PM

## 2022-02-13 NOTE — Patient Instructions (Signed)
I value your feedback and you entrusting us with your care. If you get a [facility] patient survey, I would appreciate you taking the time to let us know about your experience today. Thank you! ? ? ?

## 2022-02-27 ENCOUNTER — Other Ambulatory Visit: Payer: Self-pay

## 2022-03-07 ENCOUNTER — Ambulatory Visit
Admission: RE | Admit: 2022-03-07 | Discharge: 2022-03-07 | Disposition: A | Discharge status: Home / Self Care | Payer: 59 | Source: Ambulatory Visit | Attending: Obstetrics and Gynecology | Admitting: Obstetrics and Gynecology

## 2022-03-07 ENCOUNTER — Other Ambulatory Visit: Payer: Self-pay

## 2022-03-07 DIAGNOSIS — Z1231 Encounter for screening mammogram for malignant neoplasm of breast: Secondary | ICD-10-CM | POA: Diagnosis not present

## 2022-03-26 ENCOUNTER — Ambulatory Visit: Payer: 59 | Admitting: Dermatology

## 2022-03-26 ENCOUNTER — Other Ambulatory Visit: Payer: Self-pay

## 2022-03-26 DIAGNOSIS — L732 Hidradenitis suppurativa: Secondary | ICD-10-CM | POA: Diagnosis not present

## 2022-03-26 MED ORDER — DOXYCYCLINE MONOHYDRATE 100 MG PO CAPS
100.0000 mg | ORAL_CAPSULE | Freq: Two times a day (BID) | ORAL | 2 refills | Status: DC
  Filled 2022-03-26: qty 60, 30d supply, fill #0
  Filled 2022-04-24: qty 60, 30d supply, fill #1
  Filled 2023-03-26: qty 60, 30d supply, fill #2

## 2022-03-26 MED ORDER — CLINDAMYCIN PHOSPHATE 1 % EX FOAM
CUTANEOUS | 2 refills | Status: DC
  Filled 2022-03-26: qty 50, 30d supply, fill #0

## 2022-03-26 NOTE — Progress Notes (Deleted)
? ?  New Patient Visit ? ?Subjective  ?Megan Hunter is a 46 y.o. female who presents for the following: Follow-up (Patient here today for hidradenitis. Patient was given doxycycline 100 mg to take twice daily and clindamycin foam when she was last seen in 2020. Patient currently using over the counter panoxyl and has not had any recent break outs. ). ? ?*** ? ?The following portions of the chart were reviewed this encounter and updated as appropriate:  ?  ?  ? ?Review of Systems:  No other skin or systemic complaints except as noted in HPI or Assessment and Plan. ? ?Objective  ?Well appearing patient in no apparent distress; mood and affect are within normal limits. ? ?{Exam:34163::"A full examination was performed including scalp, head, eyes, ears, nose, lips, neck, chest, axillae, abdomen, back, buttocks, bilateral upper extremities, bilateral lower extremities, hands, feet, fingers, toes, fingernails, and toenails. All findings within normal limits unless otherwise noted below."} ? ? ? ?Assessment & Plan  ? ?No follow-ups on file. ? ? ? ?

## 2022-03-26 NOTE — Progress Notes (Signed)
? ?  Follow-Up Visit ?  ?Subjective  ?Megan Hunter is a 46 y.o. female who presents for the following: Follow-up (Patient here today for hidradenitis. Patient was given doxycycline 100 mg to take twice daily and clindamycin foam when she was last seen in 2020. Patient currently using over the counter panoxyl and taking doxycycline 100 mg twice daily as needed and feels like she has stayed controlled. ). ? ?Patient has seen Dr. Laurence Ferrari in the past. Patient was prescribed spironolactone in the past but patient advises she never took it.  ? ?The following portions of the chart were reviewed this encounter and updated as appropriate:  ?  ?  ? ?Review of Systems:  No other skin or systemic complaints except as noted in HPI or Assessment and Plan. ? ?Objective  ?Well appearing patient in no apparent distress; mood and affect are within normal limits. ? ?A focused examination was performed including axilla. Relevant physical exam findings are noted in the Assessment and Plan. ? ?Left Upper Axilla ?Violaceous soft nodule, scarring, R axilla clear, pt states groin is clear today. ? ? ? ?Assessment & Plan  ?Hidradenitis suppurativa ?Left Upper Axilla ? ?With recent flare, improving with PO doxycycline ? ?Continue clindamycin foam daily as needed ?Continue doxycycline monohydrate 100 mg twice daily with food as needed for flares for up to 2 weeks then once daily for up to 4 weeks. ?Continue PanOxyl 4% creamy wash daily. ? ?Doxycycline should be taken with food to prevent nausea. Do not lay down for 30 minutes after taking. Be cautious with sun exposure and use good sun protection while on this medication. Pregnant women should not take this medication.  ? ?Hidradenitis Suppurativa is a chronic; persistent; non-curable, but treatable condition due to abnormal inflamed sweat glands in the body folds (axilla, inframammary, groin, medial thighs), causing recurrent painful draining cysts and scarring. It can be associated with severe  scarring acne and cysts; also abscesses and scarring of scalp. The goal is control and prevention of flares, as it is not curable. Scars are permanent and can be thickened. Treatment may include daily use of topical medication and oral antibiotics.  Oral isotretinoin may also be helpful.  For more severe cases, Humira (a biologic injection) may be prescribed to decrease the inflammatory process and prevent flares.  When indicated, inflamed cysts may also be treated surgically.  ? ? ?Clindamycin Phosphate foam - Left Upper Axilla ?Apply once daily to affected areas as needed. ? ?doxycycline (MONODOX) 100 MG capsule - Left Upper Axilla ?Take 1 capsule (100 mg total) by mouth 2 (two) times daily. Take with food ? ? ?Return if symptoms worsen or fail to improve. ? ?Graciella Belton, RMA, am acting as scribe for Brendolyn Patty, MD . ? ?Documentation: I have reviewed the above documentation for accuracy and completeness, and I agree with the above. ? ?Brendolyn Patty MD  ? ?

## 2022-03-26 NOTE — Patient Instructions (Addendum)
Continue clindamycin foam daily as needed ?Continue doxycycline monohydrate 100 mg twice daily with food as needed for flares for up to 2 weeks then once daily for up to 4 weeks. ?Continue PanOxyl daily ? ?Doxycycline should be taken with food to prevent nausea. Do not lay down for 30 minutes after taking. Be cautious with sun exposure and use good sun protection while on this medication. Pregnant women should not take this medication.  ? ?If You Need Anything After Your Visit ? ?If you have any questions or concerns for your doctor, please call our main line at [phone] and press option 4 to reach your doctor's medical assistant. If no one answers, please leave a voicemail as directed and we will return your call as soon as possible. Messages left after 4 pm will be answered the following business day.  ? ?You may also send [place] a message via MyChart. We typically respond to MyChart messages within 1-2 business days. ? ?For prescription refills, please ask your pharmacy to contact our office. Our fax number is [phone]. ? ?If you have an urgent issue when the clinic is closed that cannot wait until the next business day, you can page your doctor at the number below.   ? ?Please note that while we do our best to be available for urgent issues outside of office hours, we are not available 24/7.  ? ?If you have an urgent issue and are unable to reach [place], you may choose to seek medical care at your doctor's office, retail clinic, urgent care center, or emergency room. ? ?If you have a medical emergency, please immediately call 911 or go to the emergency department. ? ?Pager Numbers ? ?- Dr. Nehemiah Massed: [phone] ? ?- Dr. Laurence Ferrari: [phone] ? ?- Dr. Nicole Kindred: [phone] ? ?In the event of inclement weather, please call our main line at [phone] for an update on the status of any delays or closures. ? ?Dermatology Medication Tips: ?Please keep the boxes that topical medications come in in order to help keep  track of the instructions about where and how to use these. Pharmacies typically print the medication instructions only on the boxes and not directly on the medication tubes.  ? ?If your medication is too expensive, please contact our office at [phone] option 4 or send [place] a message through [place].  ? ?We are unable to tell what your co-pay for medications will be in advance as this is different depending on your insurance coverage. However, we may be able to find a substitute medication at lower cost or fill out paperwork to get insurance to cover a needed medication.  ? ?If a prior authorization is required to get your medication covered by your insurance company, please allow [place] 1-2 business days to complete this process. ? ?Drug prices often vary depending on where the prescription is filled and some pharmacies may offer cheaper prices. ? ?The website www.goodrx.com contains coupons for medications through different pharmacies. The prices here do not account for what the cost may be with help from insurance (it may be cheaper with your insurance), but the website can give you the price if you did not use any insurance.  ?- You can print the associated coupon and take it with your prescription to the pharmacy.  ?- You may also stop by our office during regular business hours and pick up a GoodRx coupon card.  ?- If you need your prescription sent electronically to a different pharmacy, notify our office through [facility] MyChart or by phone at [phone] option 4. ? ? ? ? ?Si Usted Necesita Algo Despu?s de Su Visita ? ?Tambi?n puede enviarnos un mensaje a trav?s de MyChart. Por lo general respondemos a los mensajes de MyChart en el transcurso de 1 a 2 d?as h?biles. ? ?Para renovar recetas, por favor pida a su farmacia que se ponga en contacto con nuestra oficina. Nuestro n?mero de fax es el [phone]. ? ?Si tiene un asunto urgente cuando la cl?nica est? cerrada y que no puede esperar hasta el  siguiente d?a h?bil, puede llamar/localizar a su doctor(a) al n?mero que aparece a continuaci?n.  ? ?Por favor, tenga en cuenta que aunque hacemos todo lo posible para estar disponibles para asuntos urgentes fuera del horario de oficina, no estamos disponibles las 24 horas del d?a, los 7 d?as de la semana.  ? ?Si tiene un problema urgente y no puede comunicarse con nosotros, puede optar por buscar atenci?n m?dica  en el consultorio de su doctor(a), en una cl?nica privada, en un centro de atenci?n urgente o en una sala de emergencias. ? ?Si tiene Engineer, maintenance (IT) m?dica, por favor llame inmediatamente al 911 o vaya a la sala de emergencias. ? ?N?meros de b?per ? ?- Dr. Nehemiah Massed: [phone] ? ?- Dra. Moye: [phone] ? ?- Dra. Nicole Kindred: [phone] ? ?En caso de inclemencias del tiempo, por favor llame a nuestra l?nea principal al [phone] para una actualizaci?n sobre el estado de cualquier retraso o cierre. ? ?Consejos para la medicaci?n en dermatolog?a: ?Por favor, guarde las cajas en las que vienen los medicamentos de uso t?pico para ayudarle a seguir las instrucciones sobre d?nde y c?mo usarlos. Las farmacias generalmente imprimen las instrucciones del medicamento s?lo en las cajas y no directamente en los tubos del [place].  ? ?Si su medicamento es muy caro, por favor, p?ngase en contacto con Zigmund Daniel llamando al [phone] y presione la opci?n 4 o env?enos un mensaje a trav?s de MyChart.  ? ?No podemos decirle cu?l ser? su copago por los medicamentos por adelantado ya que esto es diferente dependiendo de la cobertura de su seguro. Sin embargo, es posible que podamos encontrar un medicamento sustituto a Electrical engineer un formulario para que el seguro cubra el medicamento que se considera necesario.  ? ?Si se requiere Ardelia Mems autorizaci?n previa para que su compa??a de seguros [place] su medicamento, por favor perm?tanos de 1 a 2 d?as h?biles para completar este proceso. ? ?Los precios de los  medicamentos var?an con frecuencia dependiendo del Environmental consultant de d?nde se surte la receta y alguna farmacias pueden ofrecer precios m?s baratos. ? ?El sitio web www.goodrx.com tiene cupones para medicamentos de Airline pilot. Los precios aqu? no tienen en cuenta lo que podr?a costar con la ayuda del seguro (puede ser m?s barato con su seguro), pero el sitio web puede darle el precio si no utiliz? ning?n seguro.  ?- Puede imprimir el cup?n correspondiente y llevarlo con su receta a la farmacia.  ?- Tambi?n puede pasar por nuestra oficina durante el horario de atenci?n regular y recoger una tarjeta de cupones de GoodRx.  ?- Si necesita que su receta se env?e electr?nicamente a Chiropodist, informe a nuestra oficina a trav?s de MyChart de [place] o por tel?fono llamando al [phone] y presione la opci?n 4. ? ?

## 2022-03-27 ENCOUNTER — Telehealth: Payer: Self-pay

## 2022-03-27 NOTE — Telephone Encounter (Signed)
Clindamycin Foam is now a plan exclusion for patient's insurance. The pharmacy states solution is covered.  ?

## 2022-03-29 ENCOUNTER — Other Ambulatory Visit: Payer: Self-pay

## 2022-03-29 MED ORDER — CLINDAMYCIN PHOSPHATE 1 % EX SOLN
Freq: Every day | CUTANEOUS | 0 refills | Status: AC
  Filled 2022-03-29: qty 30, 20d supply, fill #0

## 2022-03-29 NOTE — Telephone Encounter (Signed)
Solution sent in and patient advised. aw ?

## 2022-04-10 ENCOUNTER — Ambulatory Visit: Payer: 59 | Admitting: Dermatology

## 2022-04-23 ENCOUNTER — Ambulatory Visit: Payer: 59 | Admitting: Dermatology

## 2022-04-23 ENCOUNTER — Other Ambulatory Visit: Payer: Self-pay

## 2022-04-23 DIAGNOSIS — L0291 Cutaneous abscess, unspecified: Secondary | ICD-10-CM

## 2022-04-23 DIAGNOSIS — L02412 Cutaneous abscess of left axilla: Secondary | ICD-10-CM

## 2022-04-23 DIAGNOSIS — L732 Hidradenitis suppurativa: Secondary | ICD-10-CM | POA: Diagnosis not present

## 2022-04-23 MED ORDER — FLUCONAZOLE 150 MG PO TABS
150.0000 mg | ORAL_TABLET | Freq: Every day | ORAL | 1 refills | Status: DC
  Filled 2022-04-23: qty 1, 1d supply, fill #0
  Filled 2022-11-05: qty 1, 1d supply, fill #1

## 2022-04-23 NOTE — Patient Instructions (Signed)

## 2022-04-23 NOTE — Progress Notes (Signed)
? ?Follow-Up Visit ?  ?Subjective  ?Megan Hunter is a 46 y.o. female who presents for the following: Hidradenitis Suppurativa (Flared of the left axilla. She is taking doxycycline '100mg'$  daily, clindamycin solution, and PanOxyl 4% Creamy Wash. Flare started over the weekend. ). ? ? ?The following portions of the chart were reviewed this encounter and updated as appropriate:  ?  ?  ? ?Review of Systems:  No other skin or systemic complaints except as noted in HPI or Assessment and Plan. ? ?Objective  ?Well appearing patient in no apparent distress; mood and affect are within normal limits. ? ?A focused examination was performed including axilla. Relevant physical exam findings are noted in the Assessment and Plan. ? ?Left Axilla ?Firm subcutaneous nodule with erythema ? ?axilla, groin ?Scarring of the left axilla ? ? ? ?Assessment & Plan  ?Abscess ?Left Axilla ? ?vs Inflamed Cyst ? ?I&D today, bacterial culture obtained ?Increase doxycycline monohydrate '100mg'$  to 1 PO bid x 2 weeks, then decrease to qd. ? ?Doxycycline should be taken with food to prevent nausea. Do not lay down for 30 minutes after taking. Be cautious with sun exposure and use good sun protection while on this medication. Pregnant women should not take this medication.  ? ?Start Diflucan '150mg'$  take 1 po x 1 dsp #1 1Rf. ? ? ?Incision and Drainage - Left Axilla ?Location: left axilla ? ?Informed Consent: Discussed risks (permanent scarring, light or dark discoloration, infection, pain, bleeding, bruising, redness, damage to adjacent structures, and recurrence of the lesion) and benefits of the procedure, as well as the alternatives.  Informed consent was obtained. ? ?Preparation: The area was prepped with alcohol. ? ?Anesthesia: Lidocaine 1% with epinephrine ? ?Procedure Details: An incision was made overlying the lesion. The lesion drained pus and blood.  ?A small amount of fluid was drained.    ?Antibiotic ointment and a sterile pressure dressing were  applied. The patient tolerated procedure well. ? ?Total number of lesions drained: 1 ? ?Plan: The patient was instructed on post-op care. Recommend OTC analgesia as needed for pain. ? ? ?fluconazole (DIFLUCAN) 150 MG tablet - Left Axilla ?Take 1 tablet (150 mg total) by mouth daily. ? ?Related Procedures ?Anaerobic and Aerobic Culture ? ?Hidradenitis suppurativa ?axilla, groin ? ?With recent flare ? ?Increase doxycycline monohydrate 100 mg twice daily with food for up to 2 weeks, then decrease to once daily. ?Continue clindamycin foam daily as needed ?Continue PanOxyl 4% creamy wash daily. ?  ?Doxycycline should be taken with food to prevent nausea. Do not lay down for 30 minutes after taking. Be cautious with sun exposure and use good sun protection while on this medication. Pregnant women should not take this medication.  ? ?Benzoyl peroxide can cause dryness and irritation of the skin. It can also bleach fabric. When used together with Aczone (dapsone) cream, it can stain the skin orange. ? ?Hidradenitis Suppurativa is a chronic; persistent; non-curable, but treatable condition due to abnormal inflamed sweat glands in the body folds (axilla, inframammary, groin, medial thighs), causing recurrent painful draining cysts and scarring. It can be associated with severe scarring acne and cysts; also abscesses and scarring of scalp. The goal is control and prevention of flares, as it is not curable. Scars are permanent and can be thickened. Treatment may include daily use of topical medication and oral antibiotics.  Oral isotretinoin may also be helpful.  For more severe cases, Humira (a biologic injection) may be prescribed to decrease the inflammatory process and prevent  flares.  When indicated, inflamed cysts may also be treated surgically. ? ? ?Related Medications ?doxycycline (MONODOX) 100 MG capsule ?Take 1 capsule (100 mg total) by mouth 2 (two) times daily. Take with food ? ? ?Return in about 4 weeks (around  05/21/2022) for HS. ? ?I, Jamesetta Orleans, CMA, am acting as scribe for Brendolyn Patty, MD . ?Documentation: I have reviewed the above documentation for accuracy and completeness, and I agree with the above. ? ?Brendolyn Patty MD  ? ?

## 2022-04-24 ENCOUNTER — Other Ambulatory Visit: Payer: Self-pay

## 2022-04-25 ENCOUNTER — Other Ambulatory Visit: Payer: Self-pay

## 2022-04-27 LAB — ANAEROBIC AND AEROBIC CULTURE

## 2022-04-30 ENCOUNTER — Telehealth: Payer: Self-pay

## 2022-04-30 NOTE — Telephone Encounter (Signed)
Advised patient culture was negative.  ?

## 2022-04-30 NOTE — Telephone Encounter (Signed)
-----   Message from Brendolyn Patty, MD sent at 04/30/2022 12:51 PM EDT ----- ?Culture is negative - please call patient ?

## 2022-05-15 ENCOUNTER — Other Ambulatory Visit: Payer: Self-pay

## 2022-05-22 ENCOUNTER — Ambulatory Visit: Payer: 59 | Admitting: Dermatology

## 2022-05-22 DIAGNOSIS — L720 Epidermal cyst: Secondary | ICD-10-CM | POA: Diagnosis not present

## 2022-05-22 DIAGNOSIS — L732 Hidradenitis suppurativa: Secondary | ICD-10-CM

## 2022-05-22 NOTE — Progress Notes (Signed)
   Follow-Up Visit   Subjective  Megan Hunter is a 46 y.o. female who presents for the following: HS (Patient currently only using OTC Panoxyl wash - she did use the Doxycycline '100mg'$  but stopped a few days before memorial day. Inflamed cyst has improved but she does still have a firm nodule at L axilla where I&D was performed. No other active areas per patient).   The following portions of the chart were reviewed this encounter and updated as appropriate:       Review of Systems:  No other skin or systemic complaints except as noted in HPI or Assessment and Plan.  Objective  Well appearing patient in no apparent distress; mood and affect are within normal limits.  A focused examination was performed including the L axilla. Relevant physical exam findings are noted in the Assessment and Plan.  Left Axilla Violaceous scars, firm SQ nodule L axilla  L upper axilla Firm SQ nodule 0.8 cm.    Assessment & Plan  Hidradenitis suppurativa Left Axilla  Improved  Hidradenitis Suppurativa is a chronic; persistent; non-curable, but treatable condition due to abnormal inflamed sweat glands in the body folds (axilla, inframammary, groin, medial thighs), causing recurrent painful draining cysts and scarring. It can be associated with severe scarring acne and cysts; also abscesses and scarring of scalp. The goal is control and prevention of flares, as it is not curable. Scars are permanent and can be thickened. Treatment may include daily use of topical medication and oral antibiotics.  Oral isotretinoin may also be helpful.  For more severe cases, Humira (a biologic injection) may be prescribed to decrease the inflammatory process and prevent flares.  When indicated, inflamed cysts may also be treated surgically.  Continue Panoxyl wash daily and Clindamycin foam QD after shower.   If flares occur restart Doxycycline '100mg'$  po BID x 2 weeks PRN flare.    Related Medications doxycycline  (MONODOX) 100 MG capsule Take 1 capsule (100 mg total) by mouth 2 (two) times daily. Take with food  Epidermal inclusion cyst L upper axilla  S/P I&D - no longer inflamed bit still there  Discussed surgical excision, but pt defers since not irritated/sore  Benign-appearing. Exam most consistent with an epidermal inclusion cyst. Discussed that a cyst is a benign growth that can grow over time and sometimes get irritated or inflamed. Recommend observation if it is not bothersome. Discussed option of surgical excision to remove it if it is growing, symptomatic, or other changes noted. Please call for new or changing lesions so they can be evaluated.     Return if symptoms worsen or fail to improve, for HS follow up .  Luther Redo, CMA, am acting as scribe for Brendolyn Patty, MD . Documentation: I have reviewed the above documentation for accuracy and completeness, and I agree with the above.  Brendolyn Patty MD

## 2022-05-22 NOTE — Patient Instructions (Signed)

## 2022-05-30 ENCOUNTER — Other Ambulatory Visit (HOSPITAL_COMMUNITY): Payer: Self-pay

## 2022-07-21 ENCOUNTER — Other Ambulatory Visit: Payer: Self-pay

## 2022-07-21 ENCOUNTER — Encounter: Payer: Self-pay | Admitting: Emergency Medicine

## 2022-07-21 ENCOUNTER — Ambulatory Visit
Admission: EM | Admit: 2022-07-21 | Discharge: 2022-07-21 | Disposition: A | Discharge status: Home / Self Care | Payer: 59 | Attending: Emergency Medicine | Admitting: Emergency Medicine

## 2022-07-21 DIAGNOSIS — T63441A Toxic effect of venom of bees, accidental (unintentional), initial encounter: Secondary | ICD-10-CM

## 2022-07-21 DIAGNOSIS — H02843 Edema of right eye, unspecified eyelid: Secondary | ICD-10-CM

## 2022-07-21 MED ORDER — METHYLPREDNISOLONE SODIUM SUCC 125 MG IJ SOLR
80.0000 mg | Freq: Once | INTRAMUSCULAR | Status: AC
  Administered 2022-07-21: 80 mg via INTRAMUSCULAR

## 2022-07-21 MED ORDER — PREDNISONE 10 MG (21) PO TBPK: ORAL_TABLET | Freq: Every day | ORAL | 0 refills | Status: DC

## 2022-07-21 NOTE — ED Triage Notes (Signed)
Pt was stung in the eye yesterday and woke up this am with a swollen eye lid on the right

## 2022-07-21 NOTE — ED Provider Notes (Signed)
Megan Hunter    CSN: [id number] Arrival date & time: 07/21/22  0806      History   Chief Complaint Chief Complaint  Patient presents with   Insect Bite    HPI Megan Hunter is a 46 y.o. female.  Patient presents with swelling of her right upper eyelid since she woke up this morning.  She was stung by a bee on her upper eyelid yesterday.  No eye pain, drainage, changes in vision.  No difficulty swallowing or breathing.  Patient took 50 mg of Benadryl.  No fever, chills, or other symptoms.    The history is provided by the patient and medical records.    Past Medical History:  Diagnosis Date   Allergy    Hidradenitis suppurativa     Patient Active Problem List   Diagnosis Date Noted   Obesity (BMI 35.0-39.9 without comorbidity) 03/05/2018   Allergic rhinitis 03/05/2018   Hidradenitis suppurativa 04/15/2017   Vulvar abscess 02/27/2017    Past Surgical History:  Procedure Laterality Date   CESAREAN SECTION  2010   NECK SURGERY     pt sattes she had a bug bite on her neck as a child that she had to have surgery for    OB History     Gravida  2   Para  2   Term  2   Preterm      AB      Living  2      SAB      IAB      Ectopic      Multiple      Live Births  2            Home Medications    Prior to Admission medications   Medication Sig Start Date End Date Taking? Authorizing Provider  predniSONE (STERAPRED UNI-PAK 21 TAB) 10 MG (21) TBPK tablet Take by mouth daily. As directed 07/22/22  Yes Sharion Balloon, NP  clindamycin (CLEOCIN T) 1 % external solution Apply topically daily as needed Patient not taking: Reported on 05/22/2022 03/29/22 03/29/23  Brendolyn Patty, MD  doxycycline (MONODOX) 100 MG capsule Take 1 capsule (100 mg total) by mouth 2 (two) times daily. Take with food Patient not taking: Reported on 05/22/2022 03/26/22   Brendolyn Patty, MD  fluconazole (DIFLUCAN) 150 MG tablet Take 1 tablet (150 mg total) by mouth daily. Patient  not taking: Reported on 05/22/2022 04/23/22   Brendolyn Patty, MD  fluticasone [facility]) 50 MCG/ACT nasal spray Place 1 spray into both nostrils daily.    [provider]  guaiFENesin (MUCINEX) 600 MG 12 hr tablet Take 600 mg by mouth 2 (two) times daily as needed.    [provider]  loratadine (CLARITIN) 10 MG tablet Take 10 mg by mouth daily as needed for allergies.    [provider]  meloxicam (MOBIC) 15 MG tablet Take 1 tablet every day by oral route. 05/17/21     Multiple Vitamin (MULTIVITAMIN) tablet Take 1 tablet by mouth daily.    [provider]  Norethindrone Acetate-Ethinyl Estrad-FE (MICROGESTIN 24 FE) 1-20 MG-MCG(24) tablet Take 1 tablet by mouth daily. 7/37/10   Copland, Deirdre Evener, PA-C    Family History Family History  Problem Relation Age of Onset   COPD Mother    Hyperlipidemia Father    Diabetes Father        pre-diabetic   Cancer Maternal Grandfather        lung  Stroke Paternal Grandfather    Breast cancer Neg Hx     Social History Social History   Tobacco Use   Smoking status: Never   Smokeless tobacco: Never  Vaping Use   Vaping Use: Never used  Substance Use Topics   Alcohol use: Yes    Comment: occasional   Drug use: No     Allergies   Amoxicillin and Latex   Review of Systems Review of Systems  Constitutional:  Negative for chills and fever.  HENT:  Negative for sore throat, trouble swallowing and voice change.   Eyes:  Negative for pain, discharge, redness and visual disturbance.       Right eyelid swelling.  Respiratory:  Negative for cough and shortness of breath.   Cardiovascular:  Negative for chest pain and palpitations.  Skin:  Negative for color change and rash.  All other systems reviewed and are negative.    Physical Exam Triage Vital Signs ED Triage Vitals  Enc Vitals Group     BP      Pulse      Resp      Temp      Temp src      SpO2      Weight      Height      Head Circumference       Peak Flow      Pain Score      Pain Loc      Pain Edu?      Excl. in [place]?    No data found.  Updated Vital Signs BP (!) 148/104 (BP Location: Right Arm)   Pulse 95   Temp 98 F (36.7 C) (Oral)   Resp 18   SpO2 96%   Visual Acuity Right Eye Distance:   Left Eye Distance:   Bilateral Distance:    Right Eye Near:   Left Eye Near:    Bilateral Near:     Physical Exam Vitals and nursing note reviewed.  Constitutional:      General: She is not in acute distress.    Appearance: Normal appearance. She is well-developed. She is not ill-appearing.  HENT:     Mouth/Throat:     Mouth: Mucous membranes are moist.     Pharynx: Oropharynx is clear.  Eyes:     Extraocular Movements: Extraocular movements intact.     Conjunctiva/sclera: Conjunctivae normal.     Pupils: Pupils are equal, round, and reactive to light.     Comments: Right periorbital edema. See pictures.   Cardiovascular:     Rate and Rhythm: Normal rate and regular rhythm.     Heart sounds: Normal heart sounds.  Pulmonary:     Effort: Pulmonary effort is normal. No respiratory distress.     Breath sounds: Normal breath sounds.  Musculoskeletal:     Cervical back: Neck supple.  Skin:    General: Skin is warm and dry.  Neurological:     Mental Status: She is alert.  Psychiatric:        Mood and Affect: Mood normal.        Behavior: Behavior normal.       UC Treatments / Results  Labs (all labs ordered are listed, but only abnormal results are displayed) Labs Reviewed - No data to display  EKG   Radiology No results found.  Procedures Procedures (including critical care time)  Medications Ordered in UC Medications  methylPREDNISolone sodium succinate (SOLU-MEDROL) 125 mg/2 mL injection 80 mg (  80 mg Intramuscular Given 07/21/22 3536)    Initial Impression / Assessment and Plan / UC Course  I have reviewed the triage vital signs and the nursing notes.  Pertinent labs & imaging results that  were available during my care of the patient were reviewed by me and considered in my medical decision making (see chart for details).    Swelling of the right eyelids due to bee sting.  No difficulty swallowing or breathing.  No eye pain or changes in vision.  Patient took Benadryl this morning.  Solu-Medrol given here and starting prednisone taper tomorrow.  Discussed Benadryl or Zyrtec.  Instructed patient to follow-up with her PCP.  911 and ED precautions discussed.  Education provided on bee stings.  Patient agrees to plan of care.  Final Clinical Impressions(s) / UC Diagnoses   Final diagnoses:  Bee sting, accidental or unintentional, initial encounter  Swelling of eyelid, right     Discharge Instructions      You were given an injection of a steroid called Solu-Medrol.  Start the prednisone taper tomorrow as directed.    Take Benadryl or Zyrtec as directed.    Follow up with your primary care provider.   Go to the emergency department if you have difficulty swallowing or breathing.         ED Prescriptions     Medication Sig Dispense Auth. Provider   predniSONE (STERAPRED UNI-PAK 21 TAB) 10 MG (21) TBPK tablet Take by mouth daily. As directed 21 tablet Sharion Balloon, NP      PDMP not reviewed this encounter.   Sharion Balloon, NP 07/21/22 [phone]

## 2022-07-21 NOTE — Discharge Instructions (Addendum)
You were given an injection of a steroid called Solu-Medrol.  Start the prednisone taper tomorrow as directed.    Take Benadryl or Zyrtec as directed.    Follow up with your primary care provider.   Go to the emergency department if you have difficulty swallowing or breathing.

## 2022-08-10 ENCOUNTER — Other Ambulatory Visit: Payer: Self-pay

## 2022-11-05 ENCOUNTER — Other Ambulatory Visit: Payer: Self-pay

## 2023-01-28 ENCOUNTER — Other Ambulatory Visit: Payer: Self-pay

## 2023-01-29 ENCOUNTER — Other Ambulatory Visit: Payer: Self-pay

## 2023-03-26 ENCOUNTER — Other Ambulatory Visit: Payer: Self-pay

## 2023-03-27 ENCOUNTER — Ambulatory Visit: Payer: Commercial Managed Care - PPO | Admitting: Dermatology

## 2023-03-27 ENCOUNTER — Other Ambulatory Visit: Payer: Self-pay

## 2023-03-27 VITALS — BP 151/91 | HR 78

## 2023-03-27 DIAGNOSIS — D485 Neoplasm of uncertain behavior of skin: Secondary | ICD-10-CM

## 2023-03-27 DIAGNOSIS — L281 Prurigo nodularis: Secondary | ICD-10-CM

## 2023-03-27 DIAGNOSIS — Z1283 Encounter for screening for malignant neoplasm of skin: Secondary | ICD-10-CM

## 2023-03-27 DIAGNOSIS — L732 Hidradenitis suppurativa: Secondary | ICD-10-CM | POA: Diagnosis not present

## 2023-03-27 DIAGNOSIS — L578 Other skin changes due to chronic exposure to nonionizing radiation: Secondary | ICD-10-CM | POA: Diagnosis not present

## 2023-03-27 DIAGNOSIS — L814 Other melanin hyperpigmentation: Secondary | ICD-10-CM

## 2023-03-27 DIAGNOSIS — L219 Seborrheic dermatitis, unspecified: Secondary | ICD-10-CM | POA: Diagnosis not present

## 2023-03-27 DIAGNOSIS — L280 Lichen simplex chronicus: Secondary | ICD-10-CM | POA: Diagnosis not present

## 2023-03-27 DIAGNOSIS — D229 Melanocytic nevi, unspecified: Secondary | ICD-10-CM

## 2023-03-27 DIAGNOSIS — L821 Other seborrheic keratosis: Secondary | ICD-10-CM | POA: Diagnosis not present

## 2023-03-27 MED ORDER — DOXYCYCLINE MONOHYDRATE 100 MG PO CAPS
100.0000 mg | ORAL_CAPSULE | Freq: Two times a day (BID) | ORAL | 3 refills | Status: DC
  Filled 2023-04-03: qty 60, 30d supply, fill #0
  Filled 2024-03-14: qty 60, 30d supply, fill #1

## 2023-03-27 MED ORDER — CLINDAMYCIN PHOSPHATE 1 % EX FOAM
CUTANEOUS | 11 refills | Status: DC
  Filled 2023-03-27: qty 100, 30d supply, fill #0

## 2023-03-27 NOTE — Patient Instructions (Addendum)
Wound Care Instructions  Cleanse wound gently with soap and water once a day then pat dry with clean gauze. Apply a thin coat of Petrolatum (petroleum jelly, "Vaseline") over the wound (unless you have an allergy to this). We recommend that you use a new, sterile tube of Vaseline. Do not pick or remove scabs. Do not remove the yellow or white "healing tissue" from the base of the wound.  Cover the wound with fresh, clean, nonstick gauze and secure with paper tape. You may use Band-Aids in place of gauze and tape if the wound is small enough, but would recommend trimming much of the tape off as there is often too much. Sometimes Band-Aids can irritate the skin.  You should call the office for your biopsy report after 1 week if you have not already been contacted.  If you experience any problems, such as abnormal amounts of bleeding, swelling, significant bruising, significant pain, or evidence of infection, please call the office immediately.  FOR ADULT SURGERY PATIENTS: If you need something for pain relief you may take 1 extra strength Tylenol (acetaminophen) AND 2 Ibuprofen (200mg each) together every 4 hours as needed for pain. (do not take these if you are allergic to them or if you have a reason you should not take them.) Typically, you may only need pain medication for 1 to 3 days.     Due to recent changes in healthcare laws, you may see results of your pathology and/or laboratory studies on MyChart before the doctors have had a chance to review them. We understand that in some cases there may be results that are confusing or concerning to you. Please understand that not all results are received at the same time and often the doctors may need to interpret multiple results in order to provide you with the best plan of care or course of treatment. Therefore, we ask that you please give us 2 business days to thoroughly review all your results before contacting the office for clarification. Should  we see a critical lab result, you will be contacted sooner.   If You Need Anything After Your Visit  If you have any questions or concerns for your doctor, please call our main line at [phone] and press option 4 to reach your doctor's medical assistant. If no one answers, please leave a voicemail as directed and we will return your call as soon as possible. Messages left after 4 pm will be answered the following business day.   You may also send us a message via MyChart. We typically respond to MyChart messages within 1-2 business days.  For prescription refills, please ask your pharmacy to contact our office. Our fax number is [phone].  If you have an urgent issue when the clinic is closed that cannot wait until the next business day, you can page your doctor at the number below.    Please note that while we do our best to be available for urgent issues outside of office hours, we are not available 24/7.   If you have an urgent issue and are unable to reach us, you may choose to seek medical care at your doctor's office, retail clinic, urgent care center, or emergency room.  If you have a medical emergency, please immediately call 911 or go to the emergency department.  Pager Numbers  - Dr. Kowalski: [phone]  - Dr. Moye: [phone]  - Dr. Stewart: [phone]  In the event of inclement weather, please call our main line at   [phone] for an update on the status of any delays or closures.  Dermatology Medication Tips: Please keep the boxes that topical medications come in in order to help keep track of the instructions about where and how to use these. Pharmacies typically print the medication instructions only on the boxes and not directly on the medication tubes.   If your medication is too expensive, please contact our office at [phone] option 4 or send us a message through MyChart.   We are unable to tell what your co-pay for medications will be in  advance as this is different depending on your insurance coverage. However, we may be able to find a substitute medication at lower cost or fill out paperwork to get insurance to cover a needed medication.   If a prior authorization is required to get your medication covered by your insurance company, please allow us 1-2 business days to complete this process.  Drug prices often vary depending on where the prescription is filled and some pharmacies may offer cheaper prices.  The website www.goodrx.com contains coupons for medications through different pharmacies. The prices here do not account for what the cost may be with help from insurance (it may be cheaper with your insurance), but the website can give you the price if you did not use any insurance.  - You can print the associated coupon and take it with your prescription to the pharmacy.  - You may also stop by our office during regular business hours and pick up a GoodRx coupon card.  - If you need your prescription sent electronically to a different pharmacy, notify our office through [facility] MyChart or by phone at [phone] option 4.     Si Usted Necesita Algo Despus de Su Visita  Tambin puede enviarnos un mensaje a travs de MyChart. Por lo general respondemos a los mensajes de MyChart en el transcurso de 1 a 2 das hbiles.  Para renovar recetas, por favor pida a su farmacia que se ponga en contacto con nuestra oficina. Nuestro nmero de fax es el [phone].  Si tiene un asunto urgente cuando la clnica est cerrada y que no puede esperar hasta el siguiente da hbil, puede llamar/localizar a su doctor(a) al nmero que aparece a continuacin.   Por favor, tenga en cuenta que aunque hacemos todo lo posible para estar disponibles para asuntos urgentes fuera del horario de oficina, no estamos disponibles las 24 horas del da, los 7 das de la semana.   Si tiene un problema urgente y no puede comunicarse con nosotros, puede  optar por buscar atencin mdica  en el consultorio de su doctor(a), en una clnica privada, en un centro de atencin urgente o en una sala de emergencias.  Si tiene una emergencia mdica, por favor llame inmediatamente al 911 o vaya a la sala de emergencias.  Nmeros de bper  - Dr. Kowalski: [phone]  - Dra. Moye: [phone]  - Dra. Stewart: [phone]  En caso de inclemencias del tiempo, por favor llame a nuestra lnea principal al [phone] para una actualizacin sobre el estado de cualquier retraso o cierre.  Consejos para la medicacin en dermatologa: Por favor, guarde las cajas en las que vienen los medicamentos de uso tpico para ayudarle a seguir las instrucciones sobre dnde y cmo usarlos. Las farmacias generalmente imprimen las instrucciones del medicamento slo en las cajas y no directamente en los tubos del medicamento.   Si su medicamento es muy caro, por favor, pngase en contacto con   nuestra oficina llamando al [phone] y presione la opcin 4 o envenos un mensaje a travs de MyChart.   No podemos decirle cul ser su copago por los medicamentos por adelantado ya que esto es diferente dependiendo de la cobertura de su seguro. Sin embargo, es posible que podamos encontrar un medicamento sustituto a menor costo o llenar un formulario para que el seguro cubra el medicamento que se considera necesario.   Si se requiere una autorizacin previa para que su compaa de seguros cubra su medicamento, por favor permtanos de 1 a 2 das hbiles para completar este proceso.  Los precios de los medicamentos varan con frecuencia dependiendo del lugar de dnde se surte la receta y alguna farmacias pueden ofrecer precios ms baratos.  El sitio web www.goodrx.com tiene cupones para medicamentos de diferentes farmacias. Los precios aqu no tienen en cuenta lo que podra costar con la ayuda del seguro (puede ser ms barato con su seguro), pero el sitio web puede darle el  precio si no utiliz ningn seguro.  - Puede imprimir el cupn correspondiente y llevarlo con su receta a la farmacia.  - Tambin puede pasar por nuestra oficina durante el horario de atencin regular y recoger una tarjeta de cupones de GoodRx.  - Si necesita que su receta se enve electrnicamente a una farmacia diferente, informe a nuestra oficina a travs de MyChart de [place] o por telfono llamando al [phone] y presione la opcin 4.  

## 2023-03-27 NOTE — Progress Notes (Signed)
Follow-Up Visit   Subjective  Megan Hunter is a 47 y.o. female who presents for the following: Skin Cancer Screening and Full Body Skin Exam  The patient presents for Total-Body Skin Exam (TBSE) for skin cancer screening and mole check. The patient has spots, moles and lesions to be evaluated, some may be new or changing and the patient has concerns that these could be cancer. Spot on shoulder has been there several months.  It gets irritated by clothing and she tends to pick at it.    The following portions of the chart were reviewed this encounter and updated as appropriate: medications, allergies, medical history  Review of Systems:  No other skin or systemic complaints except as noted in HPI or Assessment and Plan.  Objective  Well appearing patient in no apparent distress; mood and affect are within normal limits.  A full examination was performed including scalp, head, eyes, ears, nose, lips, neck, chest, axillae, abdomen, back, buttocks, bilateral upper extremities, bilateral lower extremities, hands, feet, fingers, toes, fingernails, and toenails. All findings within normal limits unless otherwise noted below.   Relevant physical exam findings are noted in the Assessment and Plan.  Right Shoulder 1.0cm pink scaly papule       Assessment & Plan   LENTIGINES, SEBORRHEIC KERATOSES, HEMANGIOMAS (including R breast) - Benign normal skin lesions - Benign-appearing - Call for any changes  MELANOCYTIC NEVI - Tan-brown and/or pink-flesh-colored symmetric macules and papules - Benign appearing on exam today - Observation - Call clinic for new or changing moles - Recommend daily use of broad spectrum spf 30+ sunscreen to sun-exposed areas.   ACTINIC DAMAGE - Chronic condition, secondary to cumulative UV/sun exposure - diffuse scaly erythematous macules with underlying dyspigmentation - Recommend daily broad spectrum sunscreen SPF 30+ to sun-exposed areas, reapply every 2  hours as needed.  - Staying in the shade or wearing long sleeves, sun glasses (UVA+UVB protection) and wide brim hats (4-inch brim around the entire circumference of the hat) are also recommended for sun protection.  - Call for new or changing lesions.  SKIN CANCER SCREENING PERFORMED TODAY.  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 Condition is bothersome/symptomatic for patient. Currently flared.  Not itchy.   Treatment Plan: Avoid picking.   SEBORRHEIC DERMATITIS Exam: Pink scaliness of the alar creases and eyebrows.  Chronic and persistent condition with duration or expected duration over one year. Condition is bothersome/symptomatic for patient.  Mild.    Seborrheic Dermatitis is a chronic persistent rash characterized by pinkness and scaling most commonly of the mid face but also can occur on the scalp (dandruff), ears; mid chest, mid back and groin.  It tends to be exacerbated by stress and cooler weather.  People who have neurologic disease may experience new onset or exacerbation of existing seborrheic dermatitis.  The condition is not curable but treatable and can be controlled.  Treatment Plan: Start 1% hydrocortisone cream BID prn flares. Head & Shoulders to scalp for flaking.   HIDRADENITIS SUPPURATIVA Exam: Violaceous depressed papule of the R axilla  Chronic and persistent condition with duration or expected duration over one year. Condition is symptomatic/ bothersome to patient. Not currently at goal, but improved.  Hidradenitis Suppurativa is a chronic; persistent; non-curable, but treatable condition due to abnormal inflamed sweat glands in the body folds (axilla, inframammary, groin, medial thighs), causing recurrent painful draining cysts  and scarring. It can be associated with severe scarring acne and cysts; also abscesses and scarring of  scalp. The goal is control and prevention of flares, as it is not curable. Scars are permanent and can be thickened. Treatment may include daily use of topical medication and oral antibiotics.  Oral isotretinoin may also be helpful.  For some cases, Humira or Cosentyx (biologic injections) may be prescribed to decrease the inflammatory process and prevent flares.  When indicated, inflamed cysts may also be treated surgically.  Treatment Plan: Continue Panoxyl wash daily and Clindamycin foam QD after shower.   Benzoyl peroxide can cause dryness and irritation of the skin. It can also bleach fabric. When used together with Aczone (dapsone) cream, it can stain the skin orange.  If flares occur restart Doxycycline 100mg  po BID x 2 weeks with food PRN flare.   Doxycycline should be taken with food to prevent nausea. Do not lay down for 30 minutes after taking. Be cautious with sun exposure and use good sun protection while on this medication. Pregnant women should not take this medication.    Neoplasm of uncertain behavior of skin Right Shoulder  Epidermal / dermal shaving  Lesion diameter (cm):  1.2 Informed consent: discussed and consent obtained   Patient was prepped and draped in usual sterile fashion: Area prepped with alcohol. Anesthesia: the lesion was anesthetized in a standard fashion   Anesthetic:  1% lidocaine w/ epinephrine 1-100,000 buffered w/ 8.4% NaHCO3 Instrument used: flexible razor blade   Hemostasis achieved with: pressure, aluminum chloride and electrodesiccation   Outcome: patient tolerated procedure well   Post-procedure details: wound care instructions given   Post-procedure details comment:  Ointment and small bandage applied  Specimen 1 - Surgical pathology Differential Diagnosis: Prurigo Nodule w/Inflamed SK r/o SCC Check Margins: No   Avoid picking while healing.  Hidradenitis suppurativa  Related Medications doxycycline (MONODOX) 100 MG capsule Take 1  capsule (100 mg total) by mouth 2 (two) times daily. Take with food   Return in about 1 year (around 03/26/2024) for TBSE, HS.  I, Jamesetta Orleans, CMA, am acting as scribe for Brendolyn Patty, MD .   Documentation: I have reviewed the above documentation for accuracy and completeness, and I agree with the above.  Brendolyn Patty, MD

## 2023-03-28 ENCOUNTER — Other Ambulatory Visit: Payer: Self-pay | Admitting: Obstetrics and Gynecology

## 2023-03-28 DIAGNOSIS — Z1231 Encounter for screening mammogram for malignant neoplasm of breast: Secondary | ICD-10-CM

## 2023-04-03 ENCOUNTER — Telehealth: Payer: Self-pay

## 2023-04-03 ENCOUNTER — Other Ambulatory Visit: Payer: Self-pay

## 2023-04-03 NOTE — Telephone Encounter (Signed)
-----   Message from Willeen Niece, MD sent at 04/03/2023 12:01 PM EDT ----- Skin , right shoulder LICHEN SIMPLEX CHRONICUS  Benign chronic dermatitis which is exacerbated by repetitive trauma like scratching/picking, continue to avoid trauma to area while healing.   - please call patient

## 2023-04-03 NOTE — Telephone Encounter (Signed)
Advised pt of bx results/sh ?

## 2023-04-16 ENCOUNTER — Other Ambulatory Visit: Payer: Self-pay

## 2023-04-16 ENCOUNTER — Ambulatory Visit
Admission: RE | Admit: 2023-04-16 | Discharge: 2023-04-16 | Disposition: A | Discharge status: Home / Self Care | Payer: Commercial Managed Care - PPO | Source: Ambulatory Visit | Attending: Obstetrics and Gynecology | Admitting: Obstetrics and Gynecology

## 2023-04-16 DIAGNOSIS — Z1231 Encounter for screening mammogram for malignant neoplasm of breast: Secondary | ICD-10-CM | POA: Insufficient documentation

## 2023-04-25 ENCOUNTER — Other Ambulatory Visit: Payer: Self-pay

## 2023-04-25 DIAGNOSIS — Z30011 Encounter for initial prescription of contraceptive pills: Secondary | ICD-10-CM

## 2023-04-25 MED ORDER — MICROGESTIN 24 FE 1-20 MG-MCG PO TABS
1.0000 | ORAL_TABLET | Freq: Every day | ORAL | 0 refills | Status: DC
  Filled 2023-04-25: qty 84, 84d supply, fill #0

## 2023-05-08 NOTE — Progress Notes (Deleted)
PCP:  Larae Grooms, NP   No chief complaint on file.    HPI:      [name] is a 47 y.o. No obstetric history on file. who LMP was Patient's last menstrual period was 04/04/2023., presents today for her annual examination.  Her menses are regular every 28-30 days, lasting 4 days, mod flow.  Dysmenorrhea mild, occurring first 1-2 days of flow. She does not have intermenstrual bleeding.  Sex activity: single partner, contraception - condoms, would like to restart OCPs. Did lomedia in past. No hx of HTN, DVTs, migraines with aura.  Last Pap: 09/30/19  Results were: no abnormalities /neg HPV DNA  Hx of STDs: none  Last mammo: 04/16/23  Results were negative, repeat in 12 months.  Has appt 3/23 There is no FH of breast cancer. There is no FH of ovarian cancer. The patient does do self-breast exams.  Tobacco use: The patient denies current or previous tobacco use. Alcohol use: social drinker No drug use.  Exercise: very active  She does get adequate calcium and Vitamin D in her diet. Labs with PCP.  Hx of hidradenitis suppurativa, followed by derm. Sx worse in summer. Has panoxl foam to use, abx prn. Better controlled now.  Past Medical History:  Diagnosis Date   Allergy    Hidradenitis suppurativa     Past Surgical History:  Procedure Laterality Date   CESAREAN SECTION  2010   NECK SURGERY     pt sattes she had a bug bite on her neck as a child that she had to have surgery for    Family History  Problem Relation Age of Onset   COPD Mother    Hyperlipidemia Father    Diabetes Father        pre-diabetic   Cancer Maternal Grandfather        lung   Stroke Paternal Grandfather    Breast cancer Neg Hx     Social History   Socioeconomic History   Marital status: Married    Spouse name: Not on file   Number of children: Not on file   Years of education: Not on file   Highest education level: Not on file  Occupational History   Not on file  Tobacco Use    Smoking status: Never   Smokeless tobacco: Never  Vaping Use   Vaping Use: Never used  Substance and Sexual Activity   Alcohol use: Yes    Comment: occasional   Drug use: No   Sexual activity: Yes    Birth control/protection: Condom, None  Other Topics Concern   Not on file  Social History Narrative   Not on file   Social Determinants of Health   Financial Resource Strain: Not on file  Food Insecurity: Not on file  Transportation Needs: Not on file  Physical Activity: Not on file  Stress: Not on file  Social Connections: Not on file  Intimate Partner Violence: Not on file    Outpatient Medications Prior to Visit  Medication Sig Dispense Refill   Clindamycin Phosphate foam Apply to affected areas once daily after shower. 100 g 11   doxycycline (MONODOX) 100 MG capsule Take 1 capsule (100 mg total) by mouth 2 (two) times daily. Take with food 60 capsule 3   fluconazole (DIFLUCAN) 150 MG tablet Take 1 tablet (150 mg total) by mouth daily. 1 tablet 1   fluticasone (FLONASE) 50 MCG/ACT nasal spray Place 1 spray into both nostrils daily.  guaiFENesin (MUCINEX) 600 MG 12 hr tablet Take 600 mg by mouth 2 (two) times daily as needed.     loratadine (CLARITIN) 10 MG tablet Take 10 mg by mouth daily as needed for allergies.     Multiple Vitamin (MULTIVITAMIN) tablet Take 1 tablet by mouth daily.     Norethindrone Acetate-Ethinyl Estrad-FE (MICROGESTIN 24 FE) 1-20 MG-MCG(24) tablet Take 1 tablet by mouth daily. 84 tablet 0   No facility-administered medications prior to visit.      ROS:  Review of Systems  Constitutional:  Negative for fatigue, fever and unexpected weight change.  Respiratory:  Negative for cough, shortness of breath and wheezing.   Cardiovascular:  Negative for chest pain, palpitations and leg swelling.  Gastrointestinal:  Negative for blood in stool, constipation, diarrhea, nausea and vomiting.  Endocrine: Negative for cold intolerance, heat intolerance and  polyuria.  Genitourinary:  Negative for dyspareunia, dysuria, flank pain, frequency, genital sores, hematuria, menstrual problem, pelvic pain, urgency, vaginal bleeding, vaginal discharge and vaginal pain.  Musculoskeletal:  Negative for back pain, joint swelling and myalgias.  Skin:  Negative for rash and wound.  Neurological:  Negative for dizziness, syncope, light-headedness, numbness and headaches.  Hematological:  Negative for adenopathy.  Psychiatric/Behavioral:  Negative for agitation, confusion, sleep disturbance and suicidal ideas. The patient is not nervous/anxious.   BREAST: No symptoms   Objective: LMP 04/04/2023    Physical Exam Constitutional:      Appearance: She is well-developed.  Genitourinary:     Vulva normal.     Right Labia: No rash, tenderness or lesions.    Left Labia: No tenderness, lesions or rash.    No vaginal discharge, erythema or tenderness.      Right Adnexa: not tender and no mass present.    Left Adnexa: not tender and no mass present.    No cervical motion tenderness, friability or polyp.     Uterus is not enlarged or tender.  Breasts:    Right: No mass, nipple discharge, skin change or tenderness.     Left: No mass, nipple discharge, skin change or tenderness.  Neck:     Thyroid: No thyromegaly.  Cardiovascular:     Rate and Rhythm: Normal rate and regular rhythm.     Heart sounds: Normal heart sounds. No murmur heard. Pulmonary:     Effort: Pulmonary effort is normal.     Breath sounds: Normal breath sounds.  Abdominal:     Palpations: Abdomen is soft.     Tenderness: There is no abdominal tenderness. There is no guarding or rebound.  Musculoskeletal:        General: Normal range of motion.     Cervical back: Normal range of motion.  Lymphadenopathy:     Cervical: No cervical adenopathy.  Neurological:     General: No focal deficit present.     Mental Status: She is alert and oriented to person, place, and time.     Cranial Nerves:  No cranial nerve deficit.  Skin:    General: Skin is warm and dry.  Psychiatric:        Mood and Affect: Mood normal.        Behavior: Behavior normal.        Thought Content: Thought content normal.        Judgment: Judgment normal.  Vitals reviewed.     Assessment/Plan: Encounter for annual routine gynecological examination  Encounter for initial prescription of contraceptive pills - Plan: Norethindrone Acetate-Ethinyl Estrad-FE (MICROGESTIN 24 FE)  1-20 MG-MCG(24) tablet; OCP Rx eRxd. Start with next menses. F/u prn.   Encounter for screening mammogram for malignant neoplasm of breast; pt has appt.  No orders of the defined types were placed in this encounter.          GYN counsel breast self exam, mammography screening, adequate intake of calcium and vitamin D, diet and exercise     F/U  No follow-ups on file.  Kelwin Gibler B. Jerni Selmer, PA-C 05/08/2023 8:32 PM

## 2023-05-09 ENCOUNTER — Ambulatory Visit: Payer: Commercial Managed Care - PPO | Admitting: Obstetrics and Gynecology

## 2023-05-09 DIAGNOSIS — Z124 Encounter for screening for malignant neoplasm of cervix: Secondary | ICD-10-CM

## 2023-05-09 DIAGNOSIS — Z01419 Encounter for gynecological examination (general) (routine) without abnormal findings: Secondary | ICD-10-CM

## 2023-05-09 DIAGNOSIS — Z1151 Encounter for screening for human papillomavirus (HPV): Secondary | ICD-10-CM

## 2023-05-09 DIAGNOSIS — Z1231 Encounter for screening mammogram for malignant neoplasm of breast: Secondary | ICD-10-CM

## 2023-07-02 ENCOUNTER — Encounter: Payer: Self-pay | Admitting: Obstetrics and Gynecology

## 2023-07-02 ENCOUNTER — Other Ambulatory Visit: Payer: Self-pay

## 2023-07-02 ENCOUNTER — Other Ambulatory Visit (HOSPITAL_COMMUNITY)
Admission: RE | Admit: 2023-07-02 | Discharge: 2023-07-02 | Disposition: A | Discharge status: Home / Self Care | Payer: Commercial Managed Care - PPO | Source: Ambulatory Visit | Attending: Obstetrics and Gynecology | Admitting: Obstetrics and Gynecology

## 2023-07-02 ENCOUNTER — Ambulatory Visit (INDEPENDENT_AMBULATORY_CARE_PROVIDER_SITE_OTHER): Payer: Commercial Managed Care - PPO | Admitting: Obstetrics and Gynecology

## 2023-07-02 VITALS — BP 120/80 | Ht 61.0 in | Wt 185.0 lb

## 2023-07-02 DIAGNOSIS — Z124 Encounter for screening for malignant neoplasm of cervix: Secondary | ICD-10-CM | POA: Diagnosis not present

## 2023-07-02 DIAGNOSIS — Z01419 Encounter for gynecological examination (general) (routine) without abnormal findings: Secondary | ICD-10-CM | POA: Diagnosis not present

## 2023-07-02 DIAGNOSIS — Z113 Encounter for screening for infections with a predominantly sexual mode of transmission: Secondary | ICD-10-CM | POA: Insufficient documentation

## 2023-07-02 DIAGNOSIS — Z1211 Encounter for screening for malignant neoplasm of colon: Secondary | ICD-10-CM

## 2023-07-02 DIAGNOSIS — Z1151 Encounter for screening for human papillomavirus (HPV): Secondary | ICD-10-CM

## 2023-07-02 DIAGNOSIS — Z1231 Encounter for screening mammogram for malignant neoplasm of breast: Secondary | ICD-10-CM

## 2023-07-02 DIAGNOSIS — Z3041 Encounter for surveillance of contraceptive pills: Secondary | ICD-10-CM

## 2023-07-02 MED ORDER — MICROGESTIN 24 FE 1-20 MG-MCG PO TABS
1.0000 | ORAL_TABLET | Freq: Every day | ORAL | 3 refills | Status: DC
  Filled 2023-07-02: qty 84, 84d supply, fill #0
  Filled 2023-10-07: qty 84, 84d supply, fill #1
  Filled 2024-01-02: qty 84, 84d supply, fill #2
  Filled 2024-03-27: qty 84, 84d supply, fill #3

## 2023-07-02 MED ORDER — FLUCONAZOLE 150 MG PO TABS
150.0000 mg | ORAL_TABLET | Freq: Once | ORAL | 0 refills | Status: AC
  Filled 2023-07-02: qty 1, 1d supply, fill #0

## 2023-07-02 NOTE — Progress Notes (Signed)
PCP:  Larae Grooms, NP   Chief Complaint  Patient presents with   Gynecologic Exam    Right side ear pain     HPI:      [name] is a 47 y.o. No obstetric history on file. who LMP was Patient's last menstrual period was 05/26/2023 (approximate)., presents today for her annual examination.  Her menses are regular every 28-30 days, lasting 3-4 days, light flow.  Dysmenorrhea mild, improved with tylenol. She does not have intermenstrual bleeding.  Sex activity: single partner, contraception - OCPs. No hx of HTN, DVTs, migraine with aura. No pain/bleeding/dryness. Last Pap: 09/30/19  Results were: no abnormalities /neg HPV DNA  Hx of STDs: none  Last mammo: 04/16/23  Results were negative, repeat in 12 months.   There is no FH of breast cancer. There is no FH of ovarian cancer. The patient does do self-breast exams.  Tobacco use: The patient denies current or previous tobacco use. Alcohol use: social drinker No drug use.  Exercise: mod active  Colonoscopy: never  She does get adequate calcium and Vitamin D in her diet. Labs with PCP.  Hx of hidradenitis suppurativa, followed by derm. Sx worse in summer. Has panoxl foam to use, abx prn. Better controlled now. Had flare before vacation, took clindamycin. Would like diflucan Rx prn yeast vag after abx use.   Past Medical History:  Diagnosis Date   Allergy    Hidradenitis suppurativa     Past Surgical History:  Procedure Laterality Date   CESAREAN SECTION  2010   NECK SURGERY     pt sattes she had a bug bite on her neck as a child that she had to have surgery for    Family History  Problem Relation Age of Onset   COPD Mother    Hyperlipidemia Father    Diabetes Father        pre-diabetic   Cancer Maternal Grandfather        lung   Stroke Paternal Grandfather    Breast cancer Neg Hx     Social History   Socioeconomic History   Marital status: Married    Spouse name: Not on file   Number of children:  Not on file   Years of education: Not on file   Highest education level: Not on file  Occupational History   Not on file  Tobacco Use   Smoking status: Never   Smokeless tobacco: Never  Vaping Use   Vaping Use: Never used  Substance and Sexual Activity   Alcohol use: Yes    Comment: occasional   Drug use: No   Sexual activity: Yes    Birth control/protection: Pill  Other Topics Concern   Not on file  Social History Narrative   Not on file   Social Determinants of Health   Financial Resource Strain: Not on file  Food Insecurity: Not on file  Transportation Needs: Not on file  Physical Activity: Not on file  Stress: Not on file  Social Connections: Not on file  Intimate Partner Violence: Not on file    Outpatient Medications Prior to Visit  Medication Sig Dispense Refill   doxycycline (MONODOX) 100 MG capsule Take 1 capsule (100 mg total) by mouth 2 (two) times daily. Take with food 60 capsule 3   fluticasone (FLONASE) 50 MCG/ACT nasal spray Place 1 spray into both nostrils daily.     guaiFENesin (MUCINEX) 600 MG 12 hr tablet Take 600 mg by mouth 2 (  two) times daily as needed.     loratadine (CLARITIN) 10 MG tablet Take 10 mg by mouth daily as needed for allergies.     Multiple Vitamin (MULTIVITAMIN) tablet Take 1 tablet by mouth daily.     Norethindrone Acetate-Ethinyl Estrad-FE (MICROGESTIN 24 FE) 1-20 MG-MCG(24) tablet Take 1 tablet by mouth daily. 84 tablet 0   Clindamycin Phosphate foam Apply to affected areas once daily after shower. 100 g 11   fluconazole (DIFLUCAN) 150 MG tablet Take 1 tablet (150 mg total) by mouth daily. 1 tablet 1   No facility-administered medications prior to visit.      ROS:  Review of Systems  Constitutional:  Negative for fatigue, fever and unexpected weight change.  Respiratory:  Negative for cough, shortness of breath and wheezing.   Cardiovascular:  Negative for chest pain, palpitations and leg swelling.  Gastrointestinal:   Negative for blood in stool, constipation, diarrhea, nausea and vomiting.  Endocrine: Negative for cold intolerance, heat intolerance and polyuria.  Genitourinary:  Negative for dyspareunia, dysuria, flank pain, frequency, genital sores, hematuria, menstrual problem, pelvic pain, urgency, vaginal bleeding, vaginal discharge and vaginal pain.  Musculoskeletal:  Negative for back pain, joint swelling and myalgias.  Skin:  Negative for rash and wound.  Neurological:  Negative for dizziness, syncope, light-headedness, numbness and headaches.  Hematological:  Negative for adenopathy.  Psychiatric/Behavioral:  Negative for agitation, confusion, sleep disturbance and suicidal ideas. The patient is not nervous/anxious.   BREAST: No symptoms   Objective: BP 120/80   Ht 5\' 1"  (1.549 m)   Wt 185 lb (83.9 kg)   LMP 05/26/2023 (Approximate)   BMI 34.96 kg/m    Physical Exam Constitutional:      Appearance: She is well-developed.  Genitourinary:     Vulva normal.     Right Labia: No rash, tenderness or lesions.    Left Labia: No tenderness, lesions or rash.    No vaginal discharge, erythema or tenderness.      Right Adnexa: not tender and no mass present.    Left Adnexa: not tender and no mass present.    No cervical motion tenderness, friability or polyp.     Uterus is not enlarged or tender.  Breasts:    Right: No mass, nipple discharge, skin change or tenderness.     Left: No mass, nipple discharge, skin change or tenderness.  Neck:     Thyroid: No thyromegaly.  Cardiovascular:     Rate and Rhythm: Normal rate and regular rhythm.     Heart sounds: Normal heart sounds. No murmur heard. Pulmonary:     Effort: Pulmonary effort is normal.     Breath sounds: Normal breath sounds.  Abdominal:     Palpations: Abdomen is soft.     Tenderness: There is no abdominal tenderness. There is no guarding or rebound.  Musculoskeletal:        General: Normal range of motion.     Cervical back:  Normal range of motion.  Lymphadenopathy:     Cervical: No cervical adenopathy.  Neurological:     General: No focal deficit present.     Mental Status: She is alert and oriented to person, place, and time.     Cranial Nerves: No cranial nerve deficit.  Skin:    General: Skin is warm and dry.  Psychiatric:        Mood and Affect: Mood normal.        Behavior: Behavior normal.  Thought Content: Thought content normal.        Judgment: Judgment normal.  Vitals reviewed.     Assessment/Plan: Encounter for annual routine gynecological examination  Cervical cancer screening - Plan: Cytology - PAP  Screening for HPV (human papillomavirus) - Plan: Cytology - PAP  Encounter for surveillance of contraceptive pills - Plan: Norethindrone Acetate-Ethinyl Estrad-FE (MICROGESTIN 24 FE) 1-20 MG-MCG(24) tablet; OCP RF  Encounter for screening mammogram for malignant neoplasm of breast; pt current on mammo  Screening for colon cancer - Plan: Cologuard; colonoscopy/cologuard discussed. Pt elects cologuard. Ref sent. Will f/u with results.  Rx diflucan prn yeast vag sx.   Meds ordered this encounter  Medications   Norethindrone Acetate-Ethinyl Estrad-FE (MICROGESTIN 24 FE) 1-20 MG-MCG(24) tablet    Sig: Take 1 tablet by mouth daily.    Dispense:  84 tablet    Refill:  3    Order Specific Question:   Supervising Provider    Answer:   Hildred Laser [[id number]]   fluconazole (DIFLUCAN) 150 MG tablet    Sig: Take 1 tablet (150 mg total) by mouth once for 1 dose.    Dispense:  1 tablet    Refill:  0    Order Specific Question:   Supervising Provider    Answer:   Waymon Budge           GYN counsel breast self exam, mammography screening, adequate intake of calcium and vitamin D, diet and exercise     F/U  Return in about 1 year (around 07/01/2024).  Dayle Sherpa B. Angelis Gates, PA-C 07/02/2023 1:47 PM

## 2023-07-02 NOTE — Patient Instructions (Signed)
I value your feedback and you entrusting us with your care. If you get a [facility] patient survey, I would appreciate you taking the time to let us know about your experience today. Thank you! ? ? ?

## 2023-07-04 LAB — CYTOLOGY - PAP
Adequacy: ABSENT
Comment: NEGATIVE
Diagnosis: NEGATIVE
High risk HPV: NEGATIVE

## 2023-07-08 ENCOUNTER — Ambulatory Visit
Admission: EM | Admit: 2023-07-08 | Discharge: 2023-07-08 | Disposition: A | Discharge status: Home / Self Care | Payer: Commercial Managed Care - PPO | Attending: Physician Assistant | Admitting: Physician Assistant

## 2023-07-08 ENCOUNTER — Other Ambulatory Visit: Payer: Self-pay

## 2023-07-08 DIAGNOSIS — B9689 Other specified bacterial agents as the cause of diseases classified elsewhere: Secondary | ICD-10-CM | POA: Diagnosis not present

## 2023-07-08 DIAGNOSIS — J019 Acute sinusitis, unspecified: Secondary | ICD-10-CM

## 2023-07-08 LAB — GROUP A STREP BY PCR: Group A Strep by PCR: NOT DETECTED

## 2023-07-08 MED ORDER — PREDNISONE 50 MG PO TABS
50.0000 mg | ORAL_TABLET | Freq: Every day | ORAL | 0 refills | Status: AC
  Filled 2023-07-08: qty 5, 5d supply, fill #0

## 2023-07-08 MED ORDER — AZITHROMYCIN 250 MG PO TABS
ORAL_TABLET | ORAL | 0 refills | Status: DC
  Filled 2023-07-08: qty 6, 5d supply, fill #0

## 2023-07-08 NOTE — ED Provider Notes (Signed)
[facility]    CSN: [id number] Arrival date & time: 07/08/23  4098      History   Chief Complaint Chief Complaint  Patient presents with   Sore Throat   Otalgia    HPI Megan Hunter is a 47 y.o. female.    Sore Throat  Otalgia  Presents with 10+ day history of URI symptoms including sore throat, nasal congestion with sinus pain and pressure.  She endorses bilateral ear pain now x 1 week.  She reports a history of left TM rupture due to sinusitis that was untreated during her pregnancy because of concern of adverse effect to the pregnancy.  Denies fever.  Denies cough.  Past Medical History:  Diagnosis Date   Allergy    Hidradenitis suppurativa     Patient Active Problem List   Diagnosis Date Noted   Obesity (BMI 35.0-39.9 without comorbidity) 03/05/2018   Allergic rhinitis 03/05/2018   Hidradenitis suppurativa 04/15/2017   Vulvar abscess 02/27/2017    Past Surgical History:  Procedure Laterality Date   CESAREAN SECTION  2010   NECK SURGERY     pt sattes she had a bug bite on her neck as a child that she had to have surgery for    OB History     Gravida  2   Para  2   Term  2   Preterm      AB      Living  2      SAB      IAB      Ectopic      Multiple      Live Births  2            Home Medications    Prior to Admission medications   Medication Sig Start Date End Date Taking? Authorizing Provider  azithromycin (ZITHROMAX Z-PAK) 250 MG tablet Take 2 tablets (500 mg) today, then 1 tablet (250 mg) for next 4 days. 07/08/23  Yes Marika Mahaffy, Jeannett Senior, FNP  predniSONE (DELTASONE) 50 MG tablet Take 1 tablet (50 mg total) by mouth daily with breakfast for 5 days. 07/08/23 07/13/23 Yes Rondle Lohse, Jeannett Senior, FNP  doxycycline (MONODOX) 100 MG capsule Take 1 capsule (100 mg total) by mouth 2 (two) times daily. Take with food 03/27/23   Willeen Niece, MD  fluticasone [facility]) 50 MCG/ACT nasal spray Place 1 spray into both nostrils  daily.    [provider]  guaiFENesin (MUCINEX) 600 MG 12 hr tablet Take 600 mg by mouth 2 (two) times daily as needed.    [provider]  loratadine (CLARITIN) 10 MG tablet Take 10 mg by mouth daily as needed for allergies.    [provider]  Multiple Vitamin (MULTIVITAMIN) tablet Take 1 tablet by mouth daily.    [provider]  Norethindrone Acetate-Ethinyl Estrad-FE (MICROGESTIN 24 FE) 1-20 MG-MCG(24) tablet Take 1 tablet by mouth daily. 07/02/23   Copland, Ilona Sorrel, PA-C    Family History Family History  Problem Relation Age of Onset   COPD Mother    Hyperlipidemia Father    Diabetes Father        pre-diabetic   Cancer Maternal Grandfather        lung   Stroke Paternal Grandfather    Breast cancer Neg Hx     Social History Social History   Tobacco Use   Smoking status: Never   Smokeless tobacco: Never  Vaping Use   Vaping status: Never Used  Substance Use Topics  Alcohol use: Yes    Comment: occasional   Drug use: No     Allergies   Amoxicillin and Latex   Review of Systems Review of Systems  HENT:  Positive for ear pain.      Physical Exam Triage Vital Signs ED Triage Vitals  Encounter Vitals Group     BP 07/08/23 1011 (!) 146/99     Systolic BP Percentile --      Diastolic BP Percentile --      Pulse Rate 07/08/23 1011 93     Resp 07/08/23 1011 18     Temp 07/08/23 1011 98.9 F (37.2 C)     Temp Source 07/08/23 1011 Oral     SpO2 07/08/23 1011 99 %     Weight --      Height --      Head Circumference --      Peak Flow --      Pain Score 07/08/23 1009 0     Pain Loc --      Pain Education --      Exclude from Growth Chart --    No data found.  Updated Vital Signs BP (!) 146/99 (BP Location: Right Arm)   Pulse 93   Temp 98.9 F (37.2 C) (Oral)   Resp 18   LMP 05/26/2023 (Approximate)   SpO2 99%   Visual Acuity Right Eye Distance:   Left Eye Distance:   Bilateral Distance:    Right Eye Near:    Left Eye Near:    Bilateral Near:     Physical Exam Vitals reviewed.  Constitutional:      Appearance: She is well-developed.  HENT:     Right Ear: A middle ear effusion is present. Tympanic membrane is not erythematous.     Left Ear: A middle ear effusion is present. Tympanic membrane is not erythematous.     Mouth/Throat:     Pharynx: Posterior oropharyngeal erythema present. No oropharyngeal exudate.  Cardiovascular:     Rate and Rhythm: Normal rate and regular rhythm.  Pulmonary:     Effort: Pulmonary effort is normal.     Breath sounds: Normal breath sounds.  Skin:    General: Skin is warm and dry.  Neurological:     General: No focal deficit present.     Mental Status: She is alert and oriented to person, place, and time.  Psychiatric:        Mood and Affect: Mood normal.        Behavior: Behavior normal.      UC Treatments / Results  Labs (all labs ordered are listed, but only abnormal results are displayed) Labs Reviewed  GROUP A STREP BY PCR    EKG   Radiology No results found.  Procedures Procedures (including critical care time)  Medications Ordered in UC Medications - No data to display  Initial Impression / Assessment and Plan / UC Course  I have reviewed the triage vital signs and the nursing notes.  Pertinent labs & imaging results that were available during my care of the patient were reviewed by me and considered in my medical decision making (see chart for details).   Megan Hunter is a 47 y.o. female presenting with sinusitis. Patient is afebrile without recent antipyretics, satting well on room air. Overall is well appearing, well hydrated, without respiratory distress. Pulmonary exam is unremarkable.  Lungs CTAB without wheezing, rhonchi, rales. RRR without murmurs, rubs, gallops.  No erythema is present with no  peritonsillar exudates.  There is bilateral middle ear effusion.  TMs are nonerythematous bilaterally.  Reviewed relevant chart  history.   Will treat for possible bacterial involvement given duration of symptoms using azithromycin given her intolerance of amoxicillin.  Also giving course of prednisone to reduce sinus inflammation which should relieve the pressure in her ears.  Discussed side effects of prednisone which she has previously tolerated.  Counseled patient on potential for adverse effects with medications prescribed/recommended today, ER and return-to-clinic precautions discussed, patient verbalized understanding and agreement with care plan.  Final Clinical Impressions(s) / UC Diagnoses   Final diagnoses:  Acute bacterial sinusitis     Discharge Instructions      Follow up here or with your primary care provider if your symptoms are worsening or not improving with treatment.      ED Prescriptions     Medication Sig Dispense Auth. Provider   predniSONE (DELTASONE) 50 MG tablet Take 1 tablet (50 mg total) by mouth daily with breakfast for 5 days. 5 tablet Aadi Bordner, FNP   azithromycin (ZITHROMAX Z-PAK) 250 MG tablet Take 2 tablets (500 mg) today, then 1 tablet (250 mg) for next 4 days. 6 tablet Temple Sporer, FNP      PDMP not reviewed this encounter.   Charma Igo, FNP 07/08/23 1100

## 2023-07-08 NOTE — Discharge Instructions (Signed)
 Follow up here or with your primary care provider if your symptoms are worsening or not improving with treatment.     

## 2023-07-08 NOTE — ED Triage Notes (Signed)
Pt is here with a sore throat that started 7/4, bilateral ear pain since 7/9. Pt has taken OTC emds to relieve discomfort.

## 2023-10-07 ENCOUNTER — Other Ambulatory Visit: Payer: Self-pay

## 2024-01-02 ENCOUNTER — Other Ambulatory Visit: Payer: Self-pay

## 2024-02-29 IMAGING — MG MM DIGITAL SCREENING BILAT W/ TOMO AND CAD
8 series · 8 of 24 positions shown · non-contrast
Comparison: Previous exam(s).

CLINICAL DATA: Screening.

EXAM:
DIGITAL SCREENING BILATERAL MAMMOGRAM WITH TOMOSYNTHESIS AND CAD
TECHNIQUE: Bilateral screening digital craniocaudal and mediolateral oblique
mammograms were obtained. Bilateral screening digital breast
tomosynthesis was performed. The images were evaluated with
computer-aided detection.

[L MLO synth-2D]
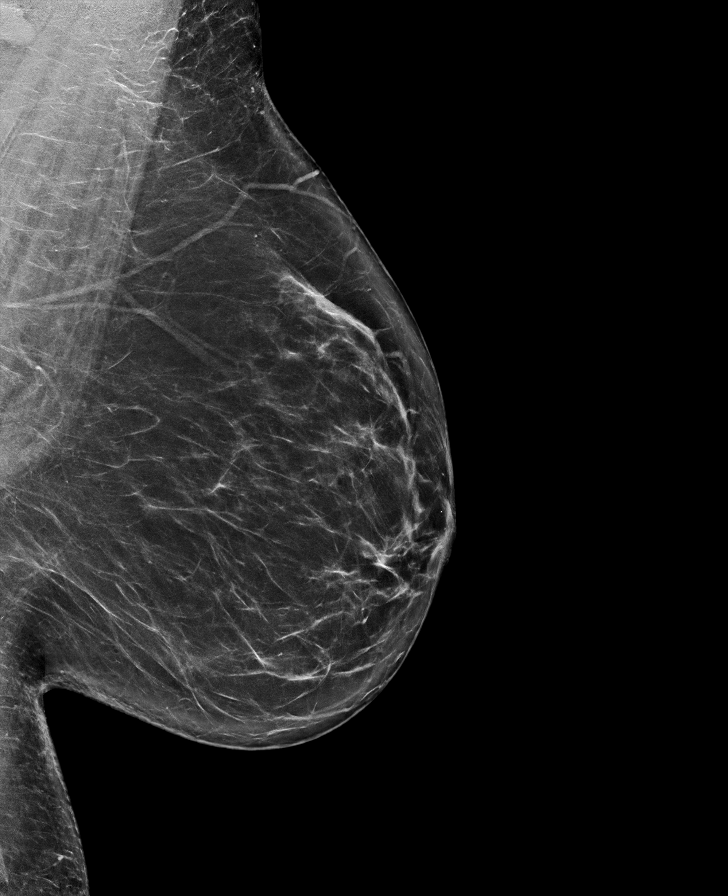

[R MLO synth-2D]
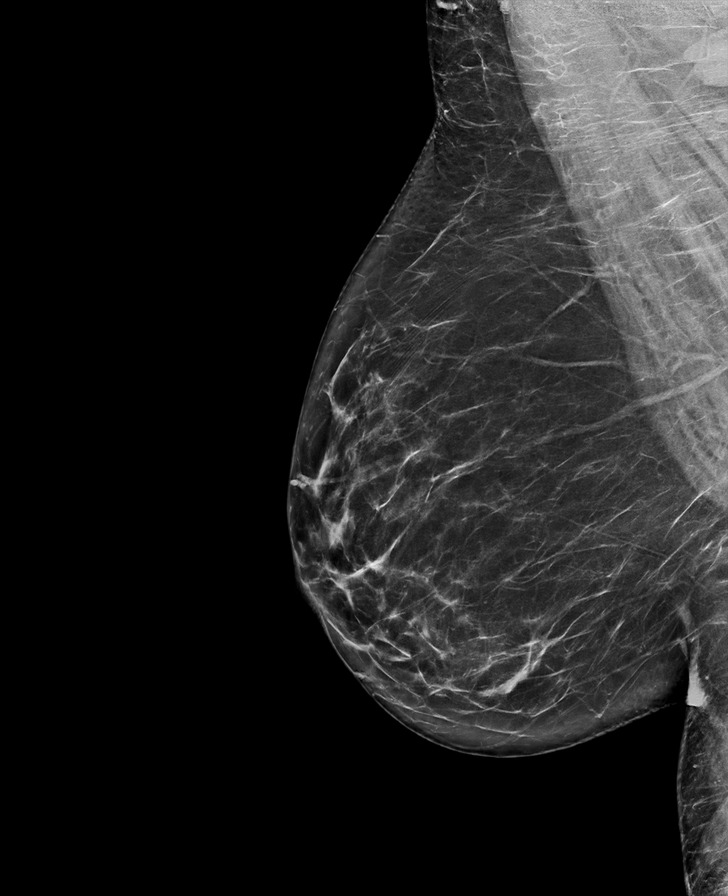

[L CC synth-2D]
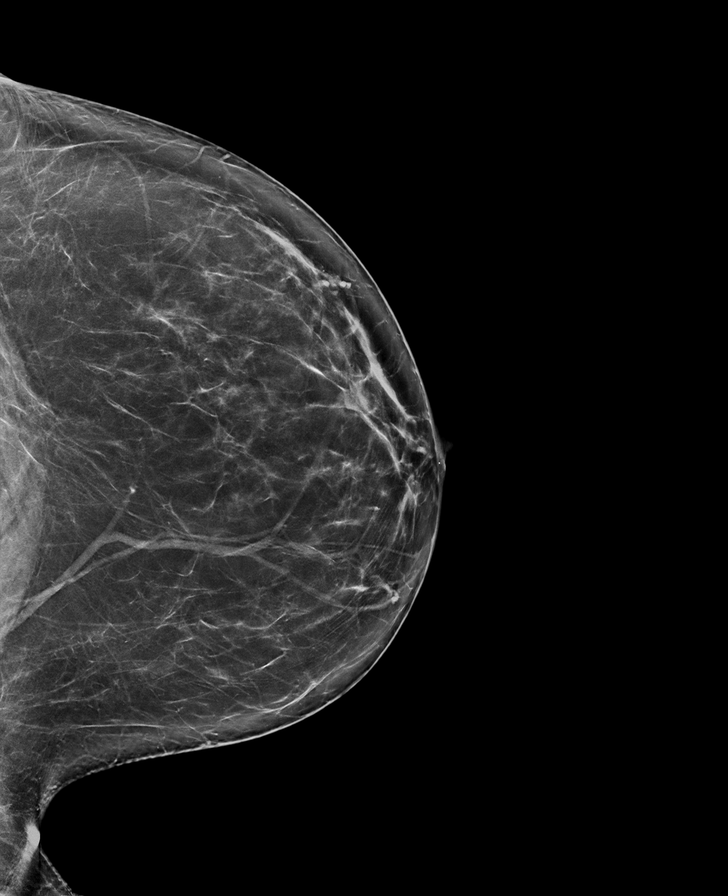

[R CC synth-2D]
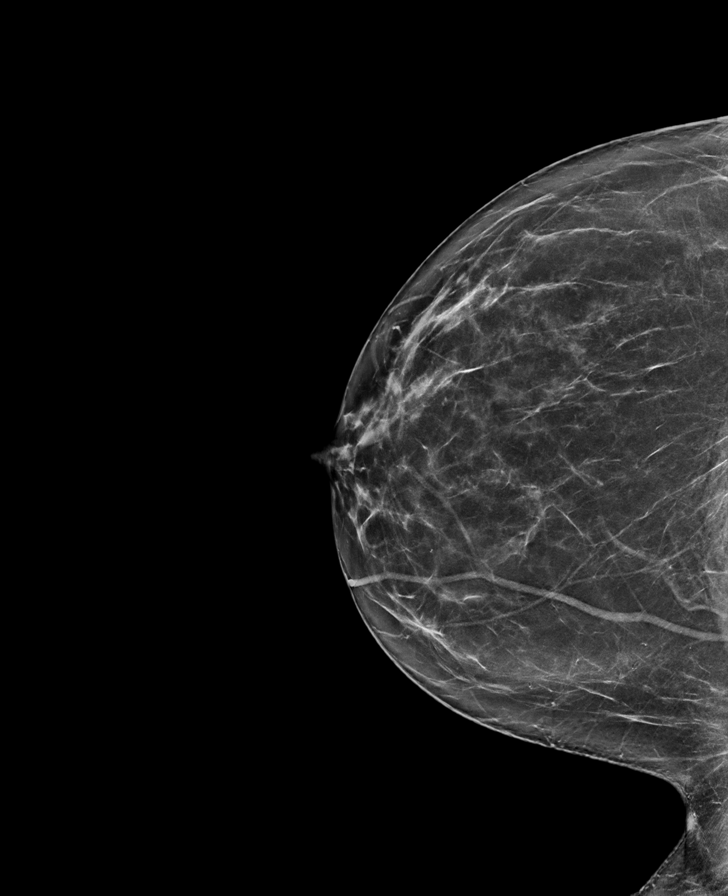

[R CC tomo · tomo slice 37/74.0]
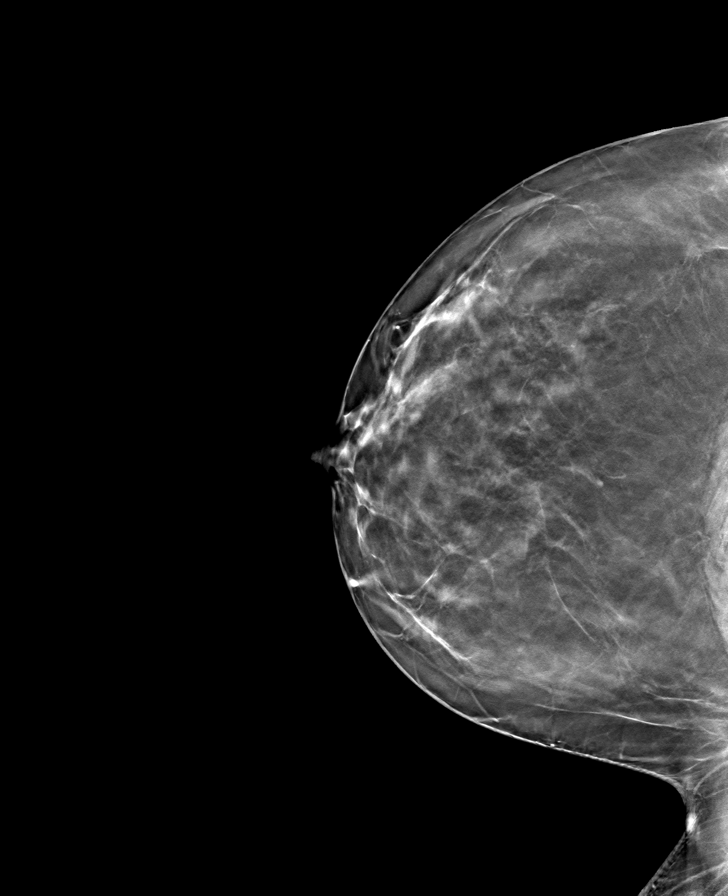

[R MLO tomo · tomo slice 43/85.0]
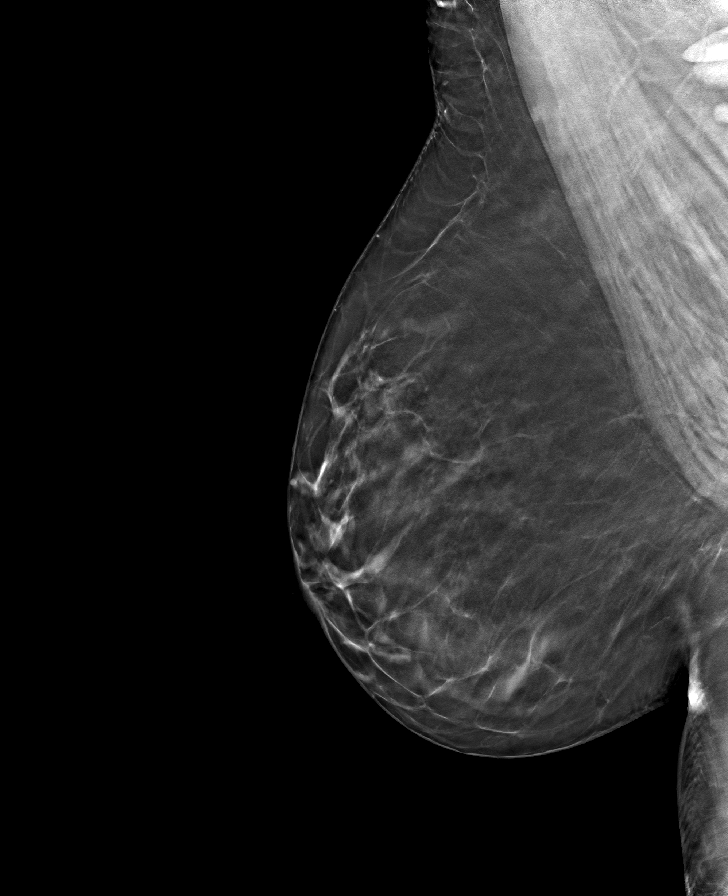

[L MLO tomo · tomo slice 44/87.0]
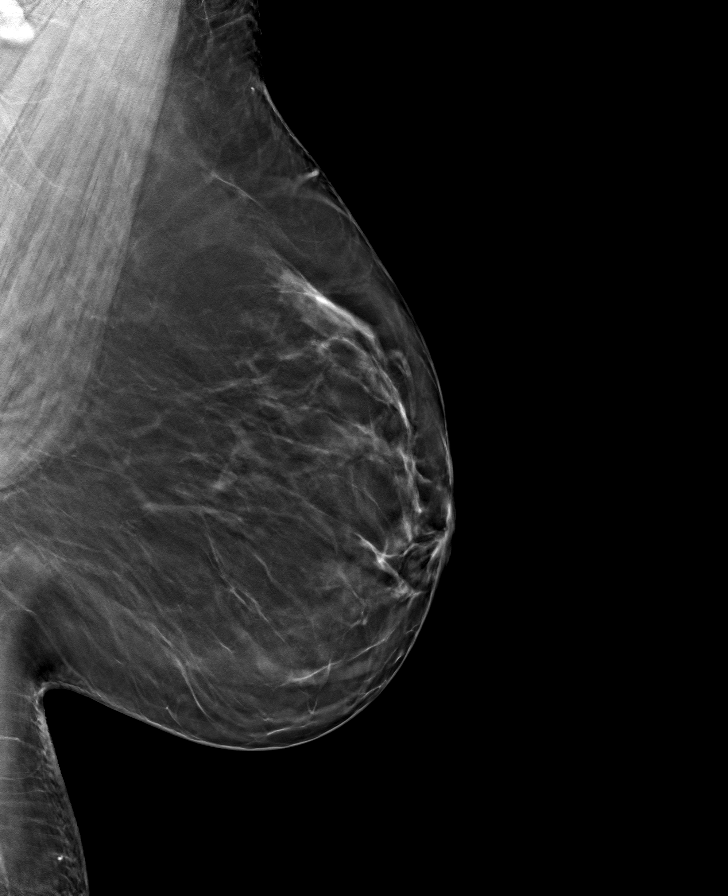

[L CC tomo · tomo slice 40/79.0]
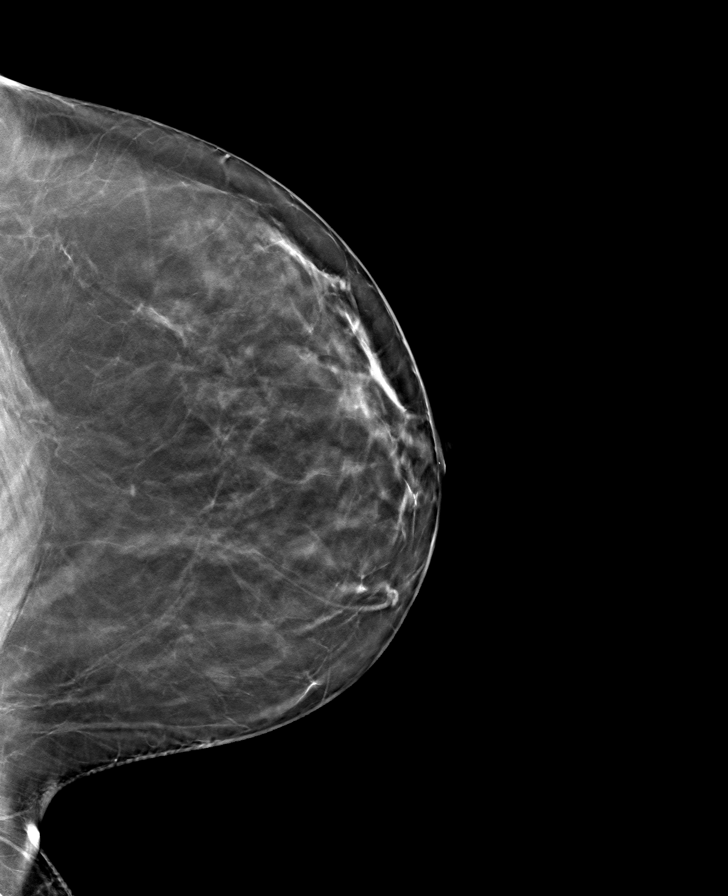

[8 of 24 positions shown; findings below may reference images not displayed]

ACR Breast Density Category b: There are scattered areas of
fibroglandular density.
FINDINGS: There are no findings suspicious for malignancy.
IMPRESSION: No mammographic evidence of malignancy. A result letter of this
screening mammogram will be mailed directly to the patient.

RECOMMENDATION:
Screening mammogram in one year. (Code:51-O-LD2)

BI-RADS CATEGORY  1: Negative.

## 2024-03-27 ENCOUNTER — Other Ambulatory Visit: Payer: Self-pay

## 2024-03-31 ENCOUNTER — Encounter: Payer: Commercial Managed Care - PPO | Admitting: Dermatology

## 2024-04-29 ENCOUNTER — Ambulatory Visit: Admitting: Dermatology

## 2024-04-29 ENCOUNTER — Other Ambulatory Visit: Payer: Self-pay

## 2024-04-29 DIAGNOSIS — L732 Hidradenitis suppurativa: Secondary | ICD-10-CM

## 2024-04-29 DIAGNOSIS — S0086XA Insect bite (nonvenomous) of other part of head, initial encounter: Secondary | ICD-10-CM

## 2024-04-29 DIAGNOSIS — L281 Prurigo nodularis: Secondary | ICD-10-CM

## 2024-04-29 DIAGNOSIS — W908XXA Exposure to other nonionizing radiation, initial encounter: Secondary | ICD-10-CM

## 2024-04-29 DIAGNOSIS — D1801 Hemangioma of skin and subcutaneous tissue: Secondary | ICD-10-CM | POA: Diagnosis not present

## 2024-04-29 DIAGNOSIS — W57XXXA Bitten or stung by nonvenomous insect and other nonvenomous arthropods, initial encounter: Secondary | ICD-10-CM

## 2024-04-29 DIAGNOSIS — L814 Other melanin hyperpigmentation: Secondary | ICD-10-CM | POA: Diagnosis not present

## 2024-04-29 DIAGNOSIS — L280 Lichen simplex chronicus: Secondary | ICD-10-CM

## 2024-04-29 DIAGNOSIS — Z1283 Encounter for screening for malignant neoplasm of skin: Secondary | ICD-10-CM | POA: Diagnosis not present

## 2024-04-29 DIAGNOSIS — S0006XA Insect bite (nonvenomous) of scalp, initial encounter: Secondary | ICD-10-CM

## 2024-04-29 DIAGNOSIS — D229 Melanocytic nevi, unspecified: Secondary | ICD-10-CM

## 2024-04-29 DIAGNOSIS — S50861A Insect bite (nonvenomous) of right forearm, initial encounter: Secondary | ICD-10-CM

## 2024-04-29 DIAGNOSIS — L821 Other seborrheic keratosis: Secondary | ICD-10-CM | POA: Diagnosis not present

## 2024-04-29 DIAGNOSIS — I781 Nevus, non-neoplastic: Secondary | ICD-10-CM

## 2024-04-29 DIAGNOSIS — L578 Other skin changes due to chronic exposure to nonionizing radiation: Secondary | ICD-10-CM | POA: Diagnosis not present

## 2024-04-29 MED ORDER — DOXYCYCLINE MONOHYDRATE 100 MG PO CAPS
100.0000 mg | ORAL_CAPSULE | Freq: Two times a day (BID) | ORAL | 5 refills | Status: DC
  Filled 2024-04-29: qty 10, 5d supply, fill #0
  Filled 2024-04-29: qty 50, 25d supply, fill #0
  Filled 2024-08-05 (×2): qty 60, 30d supply, fill #1

## 2024-04-29 MED ORDER — CLOBETASOL PROPIONATE 0.05 % EX CREA
TOPICAL_CREAM | CUTANEOUS | 1 refills | Status: AC
  Filled 2024-04-29: qty 30, 30d supply, fill #0

## 2024-04-29 MED ORDER — SPIRONOLACTONE 100 MG PO TABS
100.0000 mg | ORAL_TABLET | Freq: Every day | ORAL | 5 refills | Status: DC
  Filled 2024-04-29: qty 30, 30d supply, fill #0
  Filled 2024-06-15: qty 30, 30d supply, fill #1
  Filled 2024-08-05 – 2024-08-21 (×4): qty 30, 30d supply, fill #2
  Filled 2024-09-30: qty 30, 30d supply, fill #3

## 2024-04-29 MED ORDER — CLINDAMYCIN PHOSPHATE 1 % EX FOAM
1.0000 | Freq: Two times a day (BID) | CUTANEOUS | 5 refills | Status: DC
  Filled 2024-04-29: qty 50, 30d supply, fill #0

## 2024-04-29 NOTE — Progress Notes (Signed)
 Follow-Up Visit   Subjective  Megan Hunter is a 48 y.o. female who presents for the following: Skin Cancer Screening and Full Body Skin Exam  The patient presents for Total-Body Skin Exam (TBSE) for skin cancer screening and mole check. The patient has spots, moles and lesions to be evaluated, some may be new or changing.  She has HS of the axilla and groin, recent flare in the groin. She is taking doxycycline  100 MG twice daily for flares, and uses clindamycin  foam daily and PanOxyl Wash. She notices she gets flares before her period. History of bite reaction at left cheek, patient picks at.    The following portions of the chart were reviewed this encounter and updated as appropriate: medications, allergies, medical history  Review of Systems:  No other skin or systemic complaints except as noted in HPI or Assessment and Plan.  Objective  Well appearing patient in no apparent distress; mood and affect are within normal limits.  A full examination was performed including scalp, head, eyes, ears, nose, lips, neck, chest, axillae, abdomen, back, buttocks, bilateral upper extremities, bilateral lower extremities, hands, feet, fingers, toes, fingernails, and toenails. All findings within normal limits unless otherwise noted below.   Relevant physical exam findings are noted in the Assessment and Plan.    Assessment & Plan   SKIN CANCER SCREENING PERFORMED TODAY.  ACTINIC DAMAGE - Chronic condition, secondary to cumulative UV/sun exposure - diffuse scaly erythematous macules with underlying dyspigmentation - Recommend daily broad spectrum sunscreen SPF 30+ to sun-exposed areas, reapply every 2 hours as needed.  - Staying in the shade or wearing long sleeves, sun glasses (UVA+UVB protection) and wide brim hats (4-inch brim around the entire circumference of the hat) are also recommended for sun protection.  - Call for new or changing lesions.  LENTIGINES, SEBORRHEIC KERATOSES,  HEMANGIOMAS - Benign normal skin lesions - Benign-appearing - Call for any changes  MELANOCYTIC NEVI - Tan-brown and/or pink-flesh-colored symmetric macules and papules - Benign appearing on exam today - Observation - Call clinic for new or changing moles - Recommend daily use of broad spectrum spf 30+ sunscreen to sun-exposed areas.   HIDRADENITIS SUPPURATIVA Exam: Right perineal area with linear soft nodule with overlying erythema with scar.   Chronic and persistent condition with duration or expected duration over one year. Condition is symptomatic/ bothersome to patient. Not currently at goal.   Hidradenitis Suppurativa is a chronic; persistent; non-curable, but treatable condition due to abnormal inflamed sweat glands in the body folds (axilla, inframammary, groin, medial thighs), causing recurrent painful draining cysts and scarring. It can be associated with severe scarring acne and cysts; also abscesses and scarring of scalp. The goal is control and prevention of flares, as it is not curable. Scars are permanent and can be thickened. Treatment may include daily use of topical medication and oral antibiotics.  Oral isotretinoin may also be helpful.  For some cases, Humira or Cosentyx (biologic injections) may be prescribed to decrease the inflammatory process and prevent flares.  When indicated, inflamed cysts may also be treated surgically.  Treatment Plan: BP 138/91 Start Spironolactone 100 MG take 1 po every day dsp #30  Spironolactone can cause increased urination and cause blood pressure to decrease. Please watch for signs of lightheadedness and be cautious when changing position. It can sometimes cause breast tenderness or an irregular period in premenopausal women. It can also increase potassium. The increase in potassium usually is not a concern unless you are taking other  medicines that also increase potassium, so please be sure your doctor knows all of the other medications you  are taking. This medication should not be taken by pregnant women.  This medicine should also not be taken together with sulfa drugs like Bactrim (trimethoprim/sulfamethexazole).  Continue Panoxyl wash daily and Clindamycin  foam QD after shower.   Benzoyl peroxide can cause dryness and irritation of the skin. It can also bleach fabric.  Finish current Rx of Doxycycline . If flares occur restart Doxycycline  100mg  po BID x 2 weeks with food PRN flare.   Doxycycline  should be taken with food to prevent nausea. Do not lay down for 30 minutes after taking. Be cautious with sun exposure and use good sun protection while on this medication. Pregnant women should not take this medication.   Discussed biologic injections if not improving with oral/topical meds.   TELANGIECTASIA Exam: Blanching pink macule at left upper arm.  Treatment Plan: Benign appearing on exam Call for changes  BITE REACTION with prurigo nodule Exam: Pink excoriated papule on left cheek, scalp, and right forearm.  Treatment: Clobetasol cream twice daily until improved, no longer than 3 days on the face.  Avoid picking spot.  LICHEN SIMPLEX CHRONICUS/prurigo nodule Exam: Pink scaly patch with excoriations at right posterior shoulder. Excoriated papule and nodule (at bx site) at right shoulder.   Chronic and persistent condition with duration or expected duration over one year. Condition is symptomatic/ bothersome to patient. Not currently at goal.   Lichen simplex chronicus [facility]) is a persistent itchy area of thickened skin that is induced by chronic rubbing and/or scratching (chronic dermatitis).  These areas may be pink, hyperpigmented and may have excoriations and bumps (prurigo nodules-PN).  PN/LSC is commonly observed in uncontrolled atopic dermatitis and other forms of eczema, and in other itchy skin conditions (eg, insect bites, scabies).  Sometimes it is not possible to know initial cause of PN/LSC if it has been present  for a long time.  It generally responds well to treatment with high potency topical steroids.  It is important to stop rubbing/scratching the area in order to break the itch-scratch-rash-itch cycle, in order for the rash to resolve.   Treatment Plan: Biopsy proven LSC at right shoulder 03/27/2023. Start clobetasol cream - apply to aa right shoulder twice daily until improved. Avoid applying to face, groin, and axilla. Use as directed. Long-term use can cause thinning of the skin.  Avoid picking/rubbing/scratching Recheck on f/up   LICHEN SIMPLEX CHRONICUS   Related Medications clobetasol cream (TEMOVATE) 0.05 % Apply twice daily to affected areas right shoulder until improved. Avoid applying to face, groin, and axilla. Use as directed. Long-term use can cause thinning of the skin. HIDRADENITIS SUPPURATIVA   Related Medications spironolactone (ALDACTONE) 100 MG tablet Take 1 tablet (100 mg total) by mouth daily. doxycycline  (MONODOX ) 100 MG capsule Take 1 capsule (100 mg total) by mouth 2 (two) times daily. Take with food Return 6-8 weeks, for HS f/u, right shoulder.  IBernardine Bridegroom, CMA, am acting as scribe for Artemio Larry, MD .   Documentation: I have reviewed the above documentation for accuracy and completeness, and I agree with the above.  Artemio Larry, MD

## 2024-04-29 NOTE — Patient Instructions (Addendum)
 Start Spironolactone 100 MG take 1/2 tablet daily. If no side effects after 1-2 weeks, increase to full tablet daily. Spironolactone can cause increased urination and cause blood pressure to decrease. Please watch for signs of lightheadedness and be cautious when changing position. It can sometimes cause breast tenderness or an irregular period in premenopausal women. It can also increase potassium. The increase in potassium usually is not a concern unless you are taking other medicines that also increase potassium, so please be sure your doctor knows all of the other medications you are taking. This medication should not be taken by pregnant women.  This medicine should also not be taken together with sulfa drugs like Bactrim (trimethoprim/sulfamethexazole).   Doxycycline  should be taken with food to prevent nausea. Do not lay down for 30 minutes after taking. Be cautious with sun exposure and use good sun protection while on this medication. Pregnant women should not take this medication.    Start Clobetatsol Cream - Apply to affected areas right shoulder twice daily until improved. Apply to bites twice daily until itch improved, no longer than 3 days on the face.  Topical steroids (such as triamcinolone , fluocinolone, fluocinonide, mometasone, clobetasol, halobetasol, betamethasone, hydrocortisone) can cause thinning and lightening of the skin if they are used for too long in the same area. Your physician has selected the right strength medicine for your problem and area affected on the body. Please use your medication only as directed by your physician to prevent side effects.   Due to recent changes in healthcare laws, you may see results of your pathology and/or laboratory studies on MyChart before the doctors have had a chance to review them. We understand that in some cases there may be results that are confusing or concerning to you. Please understand that not all results are received at the same  time and often the doctors may need to interpret multiple results in order to provide you with the best plan of care or course of treatment. Therefore, we ask that you please give us  2 business days to thoroughly review all your results before contacting the office for clarification. Should we see a critical lab result, you will be contacted sooner.   If You Need Anything After Your Visit  If you have any questions or concerns for your doctor, please call our main line at [phone] and press option 4 to reach your doctor's medical assistant. If no one answers, please leave a voicemail as directed and we will return your call as soon as possible. Messages left after 4 pm will be answered the following business day.   You may also send us  a message via MyChart. We typically respond to MyChart messages within 1-2 business days.  For prescription refills, please ask your pharmacy to contact our office. Our fax number is [phone].  If you have an urgent issue when the clinic is closed that cannot wait until the next business day, you can page your doctor at the number below.    Please note that while we do our best to be available for urgent issues outside of office hours, we are not available 24/7.   If you have an urgent issue and are unable to reach us , you may choose to seek medical care at your doctor's office, retail clinic, urgent care center, or emergency room.  If you have a medical emergency, please immediately call 911 or go to the emergency department.  Pager Numbers  - Dr. Bary Likes: [phone]  - Dr.  Annette Barters: [phone]  - Dr. Felipe Horton: [phone]   In the event of inclement weather, please call our main line at [phone] for an update on the status of any delays or closures.  Dermatology Medication Tips: Please keep the boxes that topical medications come in in order to help keep track of the instructions about where and how to use these. Pharmacies typically print  the medication instructions only on the boxes and not directly on the medication tubes.   If your medication is too expensive, please contact our office at [phone] option 4 or send us  a message through MyChart.   We are unable to tell what your co-pay for medications will be in advance as this is different depending on your insurance coverage. However, we may be able to find a substitute medication at lower cost or fill out paperwork to get insurance to cover a needed medication.   If a prior authorization is required to get your medication covered by your insurance company, please allow us  1-2 business days to complete this process.  Drug prices often vary depending on where the prescription is filled and some pharmacies may offer cheaper prices.  The website www.goodrx.com contains coupons for medications through different pharmacies. The prices here do not account for what the cost may be with help from insurance (it may be cheaper with your insurance), but the website can give you the price if you did not use any insurance.  - You can print the associated coupon and take it with your prescription to the pharmacy.  - You may also stop by our office during regular business hours and pick up a GoodRx coupon card.  - If you need your prescription sent electronically to a different pharmacy, notify our office through [facility] or by phone at [phone] option 4.     Si Usted Necesita Algo Despus de Su Visita  Tambin puede enviarnos un mensaje a travs de Clinical cytogeneticist. Por lo general respondemos a los mensajes de MyChart en el transcurso de 1 a 2 das hbiles.  Para renovar recetas, por favor pida a su farmacia que se ponga en contacto con nuestra oficina. Franz Jacks de fax es [place] [phone].  Si tiene un asunto urgente cuando la clnica est cerrada y que no puede esperar hasta el siguiente da hbil, puede llamar/localizar a su doctor(a) al nmero que aparece a  continuacin.   Por favor, tenga en cuenta que aunque hacemos todo lo posible para estar disponibles para asuntos urgentes fuera del horario de [place], no estamos disponibles las 24 horas del da, los 7 [address]  [address] de la [place].   Si tiene un problema urgente y no puede comunicarse con nosotros, puede optar por buscar atencin mdica  en el consultorio de su doctor(a), en una clnica privada, en un centro de atencin urgente o en una sala de emergencias.  Si tiene Engineer, drilling, por favor llame inmediatamente al 911 o vaya a la sala de emergencias.  Nmeros de bper  - Dr. Bary Likes: [phone]  - Dra. Annette Barters: [phone]  - Dr. Felipe Horton: [phone]   En caso de inclemencias del tiempo, por favor llame a Lajuan Pila principal al [phone] para una actualizacin sobre el [place] de cualquier retraso o cierre.  Consejos para la medicacin en dermatologa: Por favor, guarde las cajas en las que vienen los medicamentos de uso tpico para ayudarle a seguir las instrucciones sobre dnde y cmo usarlos. Las farmacias generalmente imprimen las instrucciones del medicamento slo en las cajas  y no directamente en los tubos del medicamento.   Si su medicamento es muy caro, por favor, pngase en contacto con Bettyjane Brunet llamando al [phone] y presione la opcin 4 o envenos un mensaje a travs de Clinical cytogeneticist.   No podemos decirle cul ser su copago por los medicamentos por adelantado ya que esto es diferente dependiendo de la cobertura de su seguro. Sin embargo, es posible que podamos encontrar un medicamento sustituto a Audiological scientist un formulario para que el seguro cubra el medicamento que se considera necesario.   Si se requiere una autorizacin previa para que su compaa de seguros [place] su medicamento, por favor permtanos de 1 a 2 das hbiles para completar este proceso.  Los precios de los medicamentos varan con frecuencia dependiendo del Environmental consultant de dnde se surte la receta  y alguna farmacias pueden ofrecer precios ms baratos.  El sitio web www.goodrx.com tiene cupones para medicamentos de Health and safety inspector. Los precios aqu no tienen en cuenta lo que podra costar con la ayuda del seguro (puede ser ms barato con su seguro), pero el sitio web puede darle el precio si no utiliz Tourist information centre manager.  - Puede imprimir el cupn correspondiente y llevarlo con su receta a la farmacia.  - Tambin puede pasar por nuestra oficina durante el horario de atencin regular y Education officer, museum una tarjeta de cupones de GoodRx.  - Si necesita que su receta se enve electrnicamente a una farmacia diferente, informe a nuestra oficina a travs de MyChart de [place] o por telfono llamando al [phone] y presione la opcin 4.

## 2024-05-01 ENCOUNTER — Other Ambulatory Visit: Payer: Self-pay | Admitting: Obstetrics and Gynecology

## 2024-05-01 DIAGNOSIS — Z1231 Encounter for screening mammogram for malignant neoplasm of breast: Secondary | ICD-10-CM

## 2024-05-07 ENCOUNTER — Other Ambulatory Visit: Payer: Self-pay | Admitting: Obstetrics and Gynecology

## 2024-05-07 ENCOUNTER — Other Ambulatory Visit: Payer: Self-pay

## 2024-05-07 DIAGNOSIS — Z3041 Encounter for surveillance of contraceptive pills: Secondary | ICD-10-CM

## 2024-05-11 ENCOUNTER — Other Ambulatory Visit: Payer: Self-pay

## 2024-05-11 ENCOUNTER — Telehealth: Payer: Self-pay

## 2024-05-11 MED ORDER — CLINDAMYCIN PHOSPHATE 1 % EX LOTN
TOPICAL_LOTION | Freq: Two times a day (BID) | CUTANEOUS | 2 refills | Status: AC
Start: 2024-05-11 — End: 2025-05-11
  Filled 2024-05-11 – 2024-05-28 (×2): qty 60, 30d supply, fill #0

## 2024-05-11 NOTE — Telephone Encounter (Signed)
 [facility] pharmacy calling Clindamycin  foam not covered by insurance, insurance will cover gel, solution or lotion.

## 2024-05-11 NOTE — Telephone Encounter (Signed)
 Patient would prefer Clindamycin  Lotion. aw

## 2024-05-13 ENCOUNTER — Ambulatory Visit
Admission: RE | Admit: 2024-05-13 | Discharge: 2024-05-13 | Disposition: A | Discharge status: Home / Self Care | Source: Ambulatory Visit | Attending: Obstetrics and Gynecology | Admitting: Obstetrics and Gynecology

## 2024-05-13 DIAGNOSIS — Z1231 Encounter for screening mammogram for malignant neoplasm of breast: Secondary | ICD-10-CM | POA: Insufficient documentation

## 2024-05-19 ENCOUNTER — Ambulatory Visit: Payer: Self-pay | Admitting: Obstetrics and Gynecology

## 2024-05-21 ENCOUNTER — Other Ambulatory Visit: Payer: Self-pay

## 2024-05-26 ENCOUNTER — Other Ambulatory Visit: Payer: Self-pay

## 2024-05-26 NOTE — Progress Notes (Unsigned)
 PCP:  Aileen Alexanders, NP   No chief complaint on file.    HPI:      [name] is a 48 y.o. No obstetric history on file. who LMP was Patient's last menstrual period was 04/15/2024., presents today for her annual examination.  Her menses are regular every 28-30 days, lasting 3-4 days, light flow.  Dysmenorrhea mild, improved with tylenol. She does not have intermenstrual bleeding.  Sex activity: single partner, contraception - OCPs. No hx of HTN, DVTs, migraine with aura. No pain/bleeding/dryness. Last Pap: 07/02/23  Results were: no abnormalities /neg HPV DNA  Hx of STDs: none  Last mammo: 05/13/24  Results were negative, repeat in 12 months.   There is no FH of breast cancer. There is no FH of ovarian cancer. The patient does do self-breast exams.  Tobacco use: The patient denies current or previous tobacco use. Alcohol use: social drinker No drug use.  Exercise: mod active  Colonoscopy: never; wanted cologuard ref last yr for when turned 47 this yr; not done  She does get adequate calcium and Vitamin D in her diet. Labs with PCP.  Hx of hidradenitis suppurativa, followed by derm. Sx worse in summer. Has panoxl foam to use, abx prn. Better controlled now. Had flare before vacation, took clindamycin . Would like diflucan  Rx prn yeast vag after abx use.   Past Medical History:  Diagnosis Date   Allergy    Hidradenitis suppurativa     Past Surgical History:  Procedure Laterality Date   CESAREAN SECTION  2010   NECK SURGERY     pt sattes she had a bug bite on her neck as a child that she had to have surgery for    Family History  Problem Relation Age of Onset   COPD Mother    Hyperlipidemia Father    Diabetes Father        pre-diabetic   Cancer Maternal Grandfather        lung   Stroke Paternal Grandfather    Breast cancer Neg Hx     Social History   Socioeconomic History   Marital status: Married    Spouse name: Not on file   Number of children: Not  on file   Years of education: Not on file   Highest education level: Not on file  Occupational History   Not on file  Tobacco Use   Smoking status: Never   Smokeless tobacco: Never  Vaping Use   Vaping status: Never Used  Substance and Sexual Activity   Alcohol use: Yes    Comment: occasional   Drug use: No   Sexual activity: Yes    Birth control/protection: Pill  Other Topics Concern   Not on file  Social History Narrative   Not on file   Social Drivers of Health   Financial Resource Strain: Not on file  Food Insecurity: Not on file  Transportation Needs: Not on file  Physical Activity: Not on file  Stress: Not on file  Social Connections: Not on file  Intimate Partner Violence: Not on file    Outpatient Medications Prior to Visit  Medication Sig Dispense Refill   clindamycin  (CLEOCIN -T) 1 % lotion Apply topically 2 (two) times daily. 60 mL 2   clobetasol  cream (TEMOVATE ) 0.05 % Apply twice daily to affected areas right shoulder until improved. Avoid applying to face, groin, and axilla. Use as directed. Long-term use can cause thinning of the skin. 30 g 1   doxycycline  (MONODOX )  100 MG capsule Take 1 capsule (100 mg total) by mouth 2 (two) times daily. Take with food 60 capsule 5   fluticasone (FLONASE) 50 MCG/ACT nasal spray Place 1 spray into both nostrils daily.     guaiFENesin (MUCINEX) 600 MG 12 hr tablet Take 600 mg by mouth 2 (two) times daily as needed.     loratadine (CLARITIN) 10 MG tablet Take 10 mg by mouth daily as needed for allergies.     Multiple Vitamin (MULTIVITAMIN) tablet Take 1 tablet by mouth daily.     Norethindrone  Acetate-Ethinyl Estrad-FE (MICROGESTIN  24 FE) 1-20 MG-MCG(24) tablet Take 1 tablet by mouth daily. 84 tablet 3   spironolactone  (ALDACTONE ) 100 MG tablet Take 1 tablet (100 mg total) by mouth daily. 30 tablet 5   No facility-administered medications prior to visit.      ROS:  Review of Systems  Constitutional:  Negative for  fatigue, fever and unexpected weight change.  Respiratory:  Negative for cough, shortness of breath and wheezing.   Cardiovascular:  Negative for chest pain, palpitations and leg swelling.  Gastrointestinal:  Negative for blood in stool, constipation, diarrhea, nausea and vomiting.  Endocrine: Negative for cold intolerance, heat intolerance and polyuria.  Genitourinary:  Negative for dyspareunia, dysuria, flank pain, frequency, genital sores, hematuria, menstrual problem, pelvic pain, urgency, vaginal bleeding, vaginal discharge and vaginal pain.  Musculoskeletal:  Negative for back pain, joint swelling and myalgias.  Skin:  Negative for rash and wound.  Neurological:  Negative for dizziness, syncope, light-headedness, numbness and headaches.  Hematological:  Negative for adenopathy.  Psychiatric/Behavioral:  Negative for agitation, confusion, sleep disturbance and suicidal ideas. The patient is not nervous/anxious.   BREAST: No symptoms   Objective: LMP 04/15/2024    Physical Exam Constitutional:      Appearance: She is well-developed.  Genitourinary:     Vulva normal.     Right Labia: No rash, tenderness or lesions.    Left Labia: No tenderness, lesions or rash.    No vaginal discharge, erythema or tenderness.      Right Adnexa: not tender and no mass present.    Left Adnexa: not tender and no mass present.    No cervical motion tenderness, friability or polyp.     Uterus is not enlarged or tender.  Breasts:    Right: No mass, nipple discharge, skin change or tenderness.     Left: No mass, nipple discharge, skin change or tenderness.  Neck:     Thyroid: No thyromegaly.  Cardiovascular:     Rate and Rhythm: Normal rate and regular rhythm.     Heart sounds: Normal heart sounds. No murmur heard. Pulmonary:     Effort: Pulmonary effort is normal.     Breath sounds: Normal breath sounds.  Abdominal:     Palpations: Abdomen is soft.     Tenderness: There is no abdominal  tenderness. There is no guarding or rebound.  Musculoskeletal:        General: Normal range of motion.     Cervical back: Normal range of motion.  Lymphadenopathy:     Cervical: No cervical adenopathy.  Neurological:     General: No focal deficit present.     Mental Status: She is alert and oriented to person, place, and time.     Cranial Nerves: No cranial nerve deficit.  Skin:    General: Skin is warm and dry.  Psychiatric:        Mood and Affect: Mood normal.  Behavior: Behavior normal.        Thought Content: Thought content normal.        Judgment: Judgment normal.  Vitals reviewed.     Assessment/Plan: Encounter for annual routine gynecological examination  Cervical cancer screening - Plan: Cytology - PAP  Screening for HPV (human papillomavirus) - Plan: Cytology - PAP  Encounter for surveillance of contraceptive pills - Plan: Norethindrone  Acetate-Ethinyl Estrad-FE (MICROGESTIN  24 FE) 1-20 MG-MCG(24) tablet; OCP RF  Encounter for screening mammogram for malignant neoplasm of breast; pt current on mammo  Screening for colon cancer - Plan: Cologuard; colonoscopy/cologuard discussed. Pt elects cologuard. Ref sent. Will f/u with results.  Rx diflucan  prn yeast vag sx.   No orders of the defined types were placed in this encounter.          GYN counsel breast self exam, mammography screening, adequate intake of calcium and vitamin D, diet and exercise     F/U  No follow-ups on file.  Lurlene Ronda B. Dianna Ewald, PA-C 05/26/2024 1:08 PM

## 2024-05-28 ENCOUNTER — Encounter: Payer: Self-pay | Admitting: Obstetrics and Gynecology

## 2024-05-28 ENCOUNTER — Other Ambulatory Visit: Payer: Self-pay

## 2024-05-28 ENCOUNTER — Ambulatory Visit (INDEPENDENT_AMBULATORY_CARE_PROVIDER_SITE_OTHER): Admitting: Obstetrics and Gynecology

## 2024-05-28 VITALS — BP 130/83 | HR 89 | Ht 61.0 in | Wt 195.9 lb

## 2024-05-28 DIAGNOSIS — Z1211 Encounter for screening for malignant neoplasm of colon: Secondary | ICD-10-CM

## 2024-05-28 DIAGNOSIS — Z3041 Encounter for surveillance of contraceptive pills: Secondary | ICD-10-CM

## 2024-05-28 DIAGNOSIS — Z01419 Encounter for gynecological examination (general) (routine) without abnormal findings: Secondary | ICD-10-CM | POA: Diagnosis not present

## 2024-05-28 DIAGNOSIS — Z1231 Encounter for screening mammogram for malignant neoplasm of breast: Secondary | ICD-10-CM

## 2024-05-28 DIAGNOSIS — B3731 Acute candidiasis of vulva and vagina: Secondary | ICD-10-CM

## 2024-05-28 MED ORDER — FLUCONAZOLE 150 MG PO TABS
150.0000 mg | ORAL_TABLET | Freq: Once | ORAL | 1 refills | Status: AC
  Filled 2024-05-28: qty 2, 3d supply, fill #0

## 2024-05-28 MED ORDER — MICROGESTIN 24 FE 1-20 MG-MCG PO TABS
1.0000 | ORAL_TABLET | Freq: Every day | ORAL | 3 refills | Status: AC
  Filled 2024-05-28 – 2024-06-15 (×2): qty 84, 84d supply, fill #0
  Filled 2024-09-10: qty 84, 84d supply, fill #1
  Filled 2024-12-02: qty 84, 84d supply, fill #2

## 2024-05-28 NOTE — Patient Instructions (Signed)
 I value your feedback and you entrusting [place] with your care. If you get a [place] patient survey, I would appreciate you taking the time to let us know about your experience today. Thank you! ? ? ?

## 2024-06-10 ENCOUNTER — Ambulatory Visit: Admitting: Dermatology

## 2024-06-10 DIAGNOSIS — L280 Lichen simplex chronicus: Secondary | ICD-10-CM | POA: Diagnosis not present

## 2024-06-10 DIAGNOSIS — S0086XA Insect bite (nonvenomous) of other part of head, initial encounter: Secondary | ICD-10-CM | POA: Diagnosis not present

## 2024-06-10 DIAGNOSIS — S80861A Insect bite (nonvenomous), right lower leg, initial encounter: Secondary | ICD-10-CM

## 2024-06-10 DIAGNOSIS — L732 Hidradenitis suppurativa: Secondary | ICD-10-CM | POA: Diagnosis not present

## 2024-06-10 DIAGNOSIS — L281 Prurigo nodularis: Secondary | ICD-10-CM | POA: Diagnosis not present

## 2024-06-10 DIAGNOSIS — S40861A Insect bite (nonvenomous) of right upper arm, initial encounter: Secondary | ICD-10-CM

## 2024-06-10 DIAGNOSIS — W57XXXA Bitten or stung by nonvenomous insect and other nonvenomous arthropods, initial encounter: Secondary | ICD-10-CM | POA: Diagnosis not present

## 2024-06-10 DIAGNOSIS — Z79899 Other long term (current) drug therapy: Secondary | ICD-10-CM | POA: Diagnosis not present

## 2024-06-10 NOTE — Patient Instructions (Signed)

## 2024-06-10 NOTE — Progress Notes (Signed)
 Follow-Up Visit   Subjective  Megan Hunter is a 48 y.o. female who presents for the following: 6 week follow-up, HS, much improved with no breakouts since last visit. She takes spironolactone  100 mg daily, PanOxyl wash in shower, clindamycin  lotion and doxycycline  100 mg po BID x 2 weeks with food for flares.  LSC improving at right shoulder with clobetasol  cream. She has a severe reaction to bug bites, few on arms and popliteal.   The patient has spots, moles and lesions to be evaluated, some may be new or changing.    The following portions of the chart were reviewed this encounter and updated as appropriate: medications, allergies, medical history  Review of Systems:  No other skin or systemic complaints except as noted in HPI or Assessment and Plan.  Objective  Well appearing patient in no apparent distress; mood and affect are within normal limits.  A focused examination was performed of the following areas: Face, shoulder, arm Relevant physical exam findings are noted in the Assessment and Plan.    Assessment & Plan  LICHEN SIMPLEX CHRONICUS/PRURIGO NODULARIS Exam:  pink thickened scaly papules at right upper arm and shoulder  Chronic and persistent condition with duration or expected duration over one year. Condition is symptomatic/ bothersome to patient. Not currently at goal.   Lichen simplex chronicus [employer] [employer]) is a persistent itchy area of thickened skin that is induced by chronic rubbing and/or scratching (chronic dermatitis).  These areas may be pink, hyperpigmented and may have excoriations and bumps (prurigo nodules- PN).  LSC/PN is commonly observed in uncontrolled atopic dermatitis and other forms of eczema, and in other itchy skin conditions (eg, insect bites, scabies).  Sometimes it is not possible to know initial cause of LSC/PN if it has been present for a long time.  It generally responds well to treatment with high potency topical steroids.  It is important to stop  rubbing/scratching the area in order to break the itch-scratch-rash-itch cycle, in order for the rash to resolve.   Treatment Plan: Biopsy proven LSC at right shoulder 03/27/2023  Discussed IL steroid injections, pt defers at this time Continue clobetasol  cream - apply to aa twice daily until improved. Avoid applying to face, groin, and axilla. Use as directed. Long-term use can cause thinning of the skin.  Avoid picking/rubbing/scratching    BITE REACTION with prurigo nodule Exam: Small residual pink papule at left cheek; pink edematous papule at right upper arm, right shoulder, right popliteal.  Treatment: Clobetasol  cream twice daily until improved, no longer than 3 days on the face.  Avoid picking spots.  Recommend DEET containing insect repellant (25% DEET or higher) such as [place] Off or [employer]el [employer] and wear protective clothing when outdoors.    HIDRADENITIS SUPPURATIVA Exam: Not examined today, clear per patient.   Chronic condition with duration or expected duration over one year. Currently well-controlled.   Hidradenitis Suppurativa is a chronic; persistent; non-curable, but treatable condition due to abnormal inflamed sweat glands in the body folds (axilla, inframammary, groin, medial thighs), causing recurrent painful draining cysts and scarring. It can be associated with severe scarring acne and cysts; also abscesses and scarring of scalp. The goal is control and prevention of flares, as it is not curable. Scars are permanent and can be thickened. Treatment may include daily use of topical medication and oral antibiotics.  Oral isotretinoin may also be helpful.  For some cases, Humira or Cosentyx (biologic injections) may be prescribed to decrease the inflammatory process  and prevent flares.  When indicated, inflamed cysts may also be treated surgically.  Treatment Plan: Continue Spironolactone  100 MG take 1 po every day. Spironolactone  can cause increased urination and  cause blood pressure to decrease. Please watch for signs of lightheadedness and be cautious when changing position. It can sometimes cause breast tenderness or an irregular period in premenopausal women. It can also increase potassium. The increase in potassium usually is not a concern unless you are taking other medicines that also increase potassium, so please be sure your doctor knows all of the other medications you are taking. This medication should not be taken by pregnant women.  This medicine should also not be taken together with sulfa drugs like Bactrim (trimethoprim/sulfamethexazole).  Continue Panoxyl wash daily and Clindamycin  lotion QD after shower.   Benzoyl peroxide can cause dryness and irritation of the skin. It can also bleach fabric.  If flares occur restart Doxycycline  100mg  po BID x 2 weeks with food PRN flare.   Doxycycline  should be taken with food to prevent nausea. Do not lay down for 30 minutes after taking. Be cautious with sun exposure and use good sun protection while on this medication. Pregnant women should not take this medication.   Long term medication management.  Patient is using long term (months to years) prescription medication  to control their dermatologic condition.  These medications require periodic monitoring to evaluate for efficacy and side effects and may require periodic laboratory monitoring.   Return in about 6 months (around 12/10/2024) for LSC/HS.  IBernardine Bridegroom, CMA, am acting as scribe for Artemio Larry, MD .   Documentation: I have reviewed the above documentation for accuracy and completeness, and I agree with the above.  Artemio Larry, MD

## 2024-06-15 ENCOUNTER — Other Ambulatory Visit: Payer: Self-pay

## 2024-08-05 ENCOUNTER — Other Ambulatory Visit: Payer: Self-pay

## 2024-08-06 ENCOUNTER — Other Ambulatory Visit: Payer: Self-pay

## 2024-08-06 ENCOUNTER — Encounter: Payer: Self-pay | Admitting: Pharmacist

## 2024-08-11 ENCOUNTER — Other Ambulatory Visit: Payer: Self-pay

## 2024-08-12 ENCOUNTER — Encounter: Payer: Self-pay | Admitting: Nurse Practitioner

## 2024-08-12 ENCOUNTER — Ambulatory Visit: Admitting: Nurse Practitioner

## 2024-08-12 VITALS — BP 122/86 | HR 97 | Temp 97.7°F | Resp 15 | Ht 60.98 in | Wt 188.4 lb

## 2024-08-12 DIAGNOSIS — Z23 Encounter for immunization: Secondary | ICD-10-CM

## 2024-08-12 DIAGNOSIS — L732 Hidradenitis suppurativa: Secondary | ICD-10-CM

## 2024-08-12 NOTE — Progress Notes (Signed)
 BP 122/86 (BP Location: Left Arm, Patient Position: Sitting, Cuff Size: Large)   Pulse 97   Temp 97.7 F (36.5 C) (Oral)   Resp 15   Ht 5' 0.98 (1.549 m)   Wt 188 lb 6.4 oz (85.5 kg)   LMP 06/15/2024 (Approximate)   SpO2 95%   BMI 35.62 kg/m    Subjective:    Patient ID: Megan Hunter, female    DOB: Jun 10, 1976, 48 y.o.   MRN: [id number]  HPI: Megan Hunter is a 48 y.o. female  Chief Complaint  Patient presents with   Establish Care    Previous patient, last seen 07/2020.    Patient presents to clinic to establish care with new PCP.  Introduced to Publishing rights manager role and practice setting.  All questions answered.  Discussed provider/patient relationship and expectations.  Patient reports a history of hidradenitis, Obesity, HLD.  See's GYN and Dermatology.    Patient denies a history of: Hypertension, Diabetes, Thyroid problems, Depression, Anxiety, Neurological problems, and Abdominal problems.     Active Ambulatory Problems    Diagnosis Date Noted   Vulvar abscess 02/27/2017   Hidradenitis suppurativa 04/15/2017   Obesity (BMI 35.0-39.9 without comorbidity) 03/05/2018   Allergic rhinitis 03/05/2018   Resolved Ambulatory Problems    Diagnosis Date Noted   No Resolved Ambulatory Problems   Past Medical History:  Diagnosis Date   Allergy    Past Surgical History:  Procedure Laterality Date   CESAREAN SECTION  2010   NECK SURGERY     pt sattes she had a bug bite on her neck as a child that she had to have surgery for   Family History  Problem Relation Age of Onset   COPD Mother    Hyperlipidemia Father    Diabetes Father        pre-diabetic   Cancer Maternal Grandfather        lung   Stroke Paternal Grandfather    Breast cancer Neg Hx      Review of Systems  All other systems reviewed and are negative.   Per HPI unless specifically indicated above     Objective:    BP 122/86 (BP Location: Left Arm, Patient Position: Sitting, Cuff Size:  Large)   Pulse 97   Temp 97.7 F (36.5 C) (Oral)   Resp 15   Ht 5' 0.98 (1.549 m)   Wt 188 lb 6.4 oz (85.5 kg)   LMP 06/15/2024 (Approximate)   SpO2 95%   BMI 35.62 kg/m   Wt Readings from Last 3 Encounters:  08/12/24 188 lb 6.4 oz (85.5 kg)  05/28/24 195 lb 14.4 oz (88.9 kg)  07/02/23 185 lb (83.9 kg)    Physical Exam Vitals and nursing note reviewed.  Constitutional:      General: She is not in acute distress.    Appearance: Normal appearance. She is obese. She is not ill-appearing, toxic-appearing or diaphoretic.  HENT:     Head: Normocephalic.     Right Ear: External ear normal.     Left Ear: External ear normal.     Nose: Nose normal.     Mouth/Throat:     Mouth: Mucous membranes are moist.     Pharynx: Oropharynx is clear.  Eyes:     General:        Right eye: No discharge.        Left eye: No discharge.     Extraocular Movements: Extraocular movements intact.     Conjunctiva/sclera: Conjunctivae  normal.     Pupils: Pupils are equal, round, and reactive to light.  Cardiovascular:     Rate and Rhythm: Normal rate and regular rhythm.     Heart sounds: No murmur heard. Pulmonary:     Effort: Pulmonary effort is normal. No respiratory distress.     Breath sounds: Normal breath sounds. No wheezing or rales.  Musculoskeletal:     Cervical back: Normal range of motion and neck supple.  Skin:    General: Skin is warm and dry.     Capillary Refill: Capillary refill takes less than 2 seconds.  Neurological:     General: No focal deficit present.     Mental Status: She is alert and oriented to person, place, and time. Mental status is at baseline.  Psychiatric:        Mood and Affect: Mood normal.        Behavior: Behavior normal.        Thought Content: Thought content normal.        Judgment: Judgment normal.     Results for orders placed or performed during the hospital encounter of 07/08/23  Group A Strep by PCR   Collection Time: 07/08/23 10:13 AM    Specimen: Throat; Sterile Swab  Result Value Ref Range   Group A Strep by PCR NOT DETECTED NOT DETECTED      Assessment & Plan:   Problem List Items Addressed This Visit       Musculoskeletal and Integument   Hidradenitis suppurativa   Chronic.  Seeing Dermatology, on Spirolactone and doing well.       Other Visit Diagnoses       Need for tetanus booster    -  Primary   Relevant Orders   Tdap vaccine greater than or equal to 7yo IM        Follow up plan: Return in about 1 month (around 09/12/2024) for Physical and Fasting labs.

## 2024-08-12 NOTE — Assessment & Plan Note (Signed)
 Chronic.  Seeing Dermatology, on Spirolactone and doing well.

## 2024-08-21 ENCOUNTER — Other Ambulatory Visit: Payer: Self-pay

## 2024-09-10 ENCOUNTER — Other Ambulatory Visit: Payer: Self-pay

## 2024-09-14 ENCOUNTER — Encounter: Admitting: Nurse Practitioner

## 2024-09-24 ENCOUNTER — Ambulatory Visit (INDEPENDENT_AMBULATORY_CARE_PROVIDER_SITE_OTHER): Admitting: Nurse Practitioner

## 2024-09-24 ENCOUNTER — Encounter: Payer: Self-pay | Admitting: Nurse Practitioner

## 2024-09-24 VITALS — BP 132/82 | HR 87 | Ht 62.0 in | Wt 198.4 lb

## 2024-09-24 DIAGNOSIS — Z0000 Encounter for general adult medical examination without abnormal findings: Secondary | ICD-10-CM

## 2024-09-24 DIAGNOSIS — Z136 Encounter for screening for cardiovascular disorders: Secondary | ICD-10-CM | POA: Diagnosis not present

## 2024-09-24 NOTE — Progress Notes (Signed)
 BP 132/82   Pulse 87   Ht 5' 2 (1.575 m)   Wt 198 lb 6.4 oz (90 kg)   SpO2 98%   BMI 36.29 kg/m    Subjective:    Patient ID: Megan Hunter, female    DOB: 1976-07-09, 48 y.o.   MRN: [id number]  HPI: Megan Hunter is a 48 y.o. female presenting on 09/24/2024 for comprehensive medical examination. Current medical complaints include:none  She currently lives with: Menopausal Symptoms: no  Depression Screen done today and results listed below:     09/24/2024    9:33 AM 08/12/2024    9:12 AM 07/25/2020   12:03 PM 03/21/2018   10:13 AM 03/20/2018    1:28 PM  Depression screen PHQ 2/9  Decreased Interest 0 0 0 0 0  Down, Depressed, Hopeless 0 0 0 0 0  PHQ - 2 Score 0 0 0 0 0  Altered sleeping 0 0 0 1   Tired, decreased energy 0 0 0 0   Change in appetite 0 0 0 0   Feeling bad or failure about yourself  0 0 0 0   Trouble concentrating 0 0 0 0   Moving slowly or fidgety/restless 0 0 0 0   Suicidal thoughts 0 0 0 0   PHQ-9 Score 0 0 0 1     The patient does not have a history of falls. I did complete a risk assessment for falls. A plan of care for falls was documented.   Past Medical History:  Past Medical History:  Diagnosis Date   Allergy    Hidradenitis suppurativa     Surgical History:  Past Surgical History:  Procedure Laterality Date   CESAREAN SECTION  2010   NECK SURGERY     pt sattes she had a bug bite on her neck as a child that she had to have surgery for    Medications:  Current Outpatient Medications on File Prior to Visit  Medication Sig   clindamycin  (CLEOCIN -T) 1 % lotion Apply topically 2 (two) times daily.   clobetasol  cream (TEMOVATE ) 0.05 % Apply twice daily to affected areas right shoulder until improved. Avoid applying to face, groin, and axilla. Use as directed. Long-term use can cause thinning of the skin.   fluticasone (FLONASE) 50 MCG/ACT nasal spray Place 1 spray into both nostrils daily.   guaiFENesin (MUCINEX) 600 MG 12 hr tablet Take 600  mg by mouth 2 (two) times daily as needed.   loratadine (CLARITIN) 10 MG tablet Take 10 mg by mouth daily as needed for allergies.   Multiple Vitamin (MULTIVITAMIN) tablet Take 1 tablet by mouth daily.   Norethindrone  Acetate-Ethinyl Estrad-FE (MICROGESTIN  24 FE) 1-20 MG-MCG(24) tablet Take 1 tablet by mouth daily.   spironolactone  (ALDACTONE ) 100 MG tablet Take 1 tablet (100 mg total) by mouth daily.   No current facility-administered medications on file prior to visit.    Allergies:  Allergies  Allergen Reactions   Penicillin G Hives   Amoxicillin  Hives   Latex Itching and Rash    Social History:  Social History   Socioeconomic History   Marital status: Married    Spouse name: Not on file   Number of children: Not on file   Years of education: Not on file   Highest education level: Not on file  Occupational History   Not on file  Tobacco Use   Smoking status: Never   Smokeless tobacco: Never  Vaping Use   Vaping status: Never  Used  Substance and Sexual Activity   Alcohol use: Yes    Comment: occasional   Drug use: No   Sexual activity: Yes    Birth control/protection: Pill  Other Topics Concern   Not on file  Social History Narrative   Not on file   Social Drivers of Health   Financial Resource Strain: Not on file  Food Insecurity: Not on file  Transportation Needs: Not on file  Physical Activity: Not on file  Stress: Not on file  Social Connections: Not on file  Intimate Partner Violence: Not on file   Social History   Tobacco Use  Smoking Status Never  Smokeless Tobacco Never   Social History   Substance and Sexual Activity  Alcohol Use Yes   Comment: occasional    Family History:  Family History  Problem Relation Age of Onset   COPD Mother    Hyperlipidemia Father    Diabetes Father        pre-diabetic   Cancer Maternal Grandfather        lung   Stroke Paternal Grandfather    Breast cancer Neg Hx     Past medical history, surgical  history, medications, allergies, family history and social history reviewed with patient today and changes made to appropriate areas of the chart.   Review of Systems  All other systems reviewed and are negative.  All other ROS negative except what is listed above and in the HPI.      Objective:    BP 132/82   Pulse 87   Ht 5' 2 (1.575 m)   Wt 198 lb 6.4 oz (90 kg)   SpO2 98%   BMI 36.29 kg/m   Wt Readings from Last 3 Encounters:  09/24/24 198 lb 6.4 oz (90 kg)  08/12/24 188 lb 6.4 oz (85.5 kg)  05/28/24 195 lb 14.4 oz (88.9 kg)    Physical Exam Vitals and nursing note reviewed.  Constitutional:      General: She is awake. She is not in acute distress.    Appearance: Normal appearance. She is well-developed. She is obese. She is not ill-appearing.  HENT:     Head: Normocephalic and atraumatic.     Right Ear: Hearing, tympanic membrane, ear canal and external ear normal. No drainage.     Left Ear: Hearing, tympanic membrane, ear canal and external ear normal. No drainage.     Nose: Nose normal.     Right Sinus: No maxillary sinus tenderness or frontal sinus tenderness.     Left Sinus: No maxillary sinus tenderness or frontal sinus tenderness.     Mouth/Throat:     Mouth: Mucous membranes are moist.     Pharynx: Oropharynx is clear. Uvula midline. No pharyngeal swelling, oropharyngeal exudate or posterior oropharyngeal erythema.  Eyes:     General: Lids are normal.        Right eye: No discharge.        Left eye: No discharge.     Extraocular Movements: Extraocular movements intact.     Conjunctiva/sclera: Conjunctivae normal.     Pupils: Pupils are equal, round, and reactive to light.     Visual Fields: Right eye visual fields normal and left eye visual fields normal.  Neck:     Thyroid: No thyromegaly.     Vascular: No carotid bruit.     Trachea: Trachea normal.  Cardiovascular:     Rate and Rhythm: Normal rate and regular rhythm.     Heart  sounds: Normal heart  sounds. No murmur heard.    No gallop.  Pulmonary:     Effort: Pulmonary effort is normal. No accessory muscle usage or respiratory distress.     Breath sounds: Normal breath sounds.  Chest:  Breasts:    Right: Normal.     Left: Normal.  Abdominal:     General: Bowel sounds are normal.     Palpations: Abdomen is soft. There is no hepatomegaly or splenomegaly.     Tenderness: There is no abdominal tenderness.  Musculoskeletal:        General: Normal range of motion.     Cervical back: Normal range of motion and neck supple.     Right lower leg: No edema.     Left lower leg: No edema.  Lymphadenopathy:     Head:     Right side of head: No submental, submandibular, tonsillar, preauricular or posterior auricular adenopathy.     Left side of head: No submental, submandibular, tonsillar, preauricular or posterior auricular adenopathy.     Cervical: No cervical adenopathy.     Upper Body:     Right upper body: No supraclavicular, axillary or pectoral adenopathy.     Left upper body: No supraclavicular, axillary or pectoral adenopathy.  Skin:    General: Skin is warm and dry.     Capillary Refill: Capillary refill takes less than 2 seconds.     Findings: No rash.  Neurological:     Mental Status: She is alert and oriented to person, place, and time.     Gait: Gait is intact.  Psychiatric:        Attention and Perception: Attention normal.        Mood and Affect: Mood normal.        Speech: Speech normal.        Behavior: Behavior normal. Behavior is cooperative.        Thought Content: Thought content normal.        Judgment: Judgment normal.     Results for orders placed or performed during the hospital encounter of 07/08/23  Group A Strep by PCR   Collection Time: 07/08/23 10:13 AM   Specimen: Throat; Sterile Swab  Result Value Ref Range   Group A Strep by PCR NOT DETECTED NOT DETECTED      Assessment & Plan:   Problem List Items Addressed This Visit   None Visit  Diagnoses       Annual physical exam    -  Primary   Health maintenance reviewed duirng visit today.  Labs ordered.  Vaccines reviewed.  PAP up to date. Cologuard was already ordered. Mammogram up to date.   Relevant Orders   CBC with Differential/Platelet   Comprehensive metabolic panel with GFR   Lipid panel   TSH     Screening for ischemic heart disease       Relevant Orders   Lipid panel        Follow up plan: Return in about 1 year (around 09/24/2025) for Physical and Fasting labs.   LABORATORY TESTING:  - Pap smear: up to date  IMMUNIZATIONS:   - Tdap: Tetanus vaccination status reviewed: last tetanus booster within 10 years. - Influenza: Will get at work - Pneumovax: Not applicable - Prevnar: Not applicable - COVID: Refused - HPV: Not applicable - Shingrix vaccine: Not applicable  SCREENING: -Mammogram: Not applicable  - Colonoscopy: Not applicable  - Bone Density: Not applicable  -Hearing Test: Not applicable  -  Spirometry: Not applicable   PATIENT COUNSELING:   Advised to take 1 mg of folate supplement per day if capable of pregnancy.   Sexuality: Discussed sexually transmitted diseases, partner selection, use of condoms, avoidance of unintended pregnancy  and contraceptive alternatives.   Advised to avoid cigarette smoking.  I discussed with the patient that most people either abstain from alcohol or drink within safe limits (<=14/week and <=4 drinks/occasion for males, <=7/weeks and <= 3 drinks/occasion for females) and that the risk for alcohol disorders and other health effects rises proportionally with the number of drinks per week and how often a drinker exceeds daily limits.  Discussed cessation/primary prevention of drug use and availability of treatment for abuse.   Diet: Encouraged to adjust caloric intake to maintain  or achieve ideal body weight, to reduce intake of dietary saturated fat and total fat, to limit sodium intake by avoiding high sodium  foods and not adding table salt, and to maintain adequate dietary potassium and calcium preferably from fresh fruits, vegetables, and low-fat dairy products.    stressed the importance of regular exercise  Injury prevention: Discussed safety belts, safety helmets, smoke detector, smoking near bedding or upholstery.   Dental health: Discussed importance of regular tooth brushing, flossing, and dental visits.    NEXT PREVENTATIVE PHYSICAL DUE IN 1 YEAR. Return in about 1 year (around 09/24/2025) for Physical and Fasting labs.

## 2024-09-25 ENCOUNTER — Ambulatory Visit: Payer: Self-pay | Admitting: Nurse Practitioner

## 2024-09-25 LAB — CBC WITH DIFFERENTIAL/PLATELET
Basophils Absolute: 0 x10E3/uL (ref 0.0–0.2)
Basos: 0 %
EOS (ABSOLUTE): 0.1 x10E3/uL (ref 0.0–0.4)
Eos: 1 %
Hematocrit: 43.5 % (ref 34.0–46.6)
Hemoglobin: 13.7 g/dL (ref 11.1–15.9)
Immature Grans (Abs): 0 x10E3/uL (ref 0.0–0.1)
Immature Granulocytes: 0 %
Lymphocytes Absolute: 2.3 x10E3/uL (ref 0.7–3.1)
Lymphs: 32 %
MCH: 29.7 pg (ref 26.6–33.0)
MCHC: 31.5 g/dL (ref 31.5–35.7)
MCV: 94 fL (ref 79–97)
Monocytes Absolute: 0.5 x10E3/uL (ref 0.1–0.9)
Monocytes: 6 %
Neutrophils Absolute: 4.4 x10E3/uL (ref 1.4–7.0)
Neutrophils: 61 %
Platelets: 222 x10E3/uL (ref 150–450)
RBC: 4.61 x10E6/uL (ref 3.77–5.28)
RDW: 12.3 % (ref 11.7–15.4)
WBC: 7.3 x10E3/uL (ref 3.4–10.8)

## 2024-09-25 LAB — COMPREHENSIVE METABOLIC PANEL WITH GFR
ALT: 23 IU/L (ref 0–32)
AST: 20 IU/L (ref 0–40)
Albumin: 4.1 g/dL (ref 3.9–4.9)
Alkaline Phosphatase: 26 IU/L — ABNORMAL LOW (ref 41–116)
BUN/Creatinine Ratio: 15 (ref 9–23)
BUN: 11 mg/dL (ref 6–24)
Bilirubin Total: 0.2 mg/dL (ref 0.0–1.2)
CO2: 21 mmol/L (ref 20–29)
Calcium: 9.7 mg/dL (ref 8.7–10.2)
Chloride: 102 mmol/L (ref 96–106)
Creatinine, Ser: 0.72 mg/dL (ref 0.57–1.00)
Globulin, Total: 2.8 g/dL (ref 1.5–4.5)
Glucose: 93 mg/dL (ref 70–99)
Potassium: 4.6 mmol/L (ref 3.5–5.2)
Sodium: 137 mmol/L (ref 134–144)
Total Protein: 6.9 g/dL (ref 6.0–8.5)
eGFR: 104 mL/min/1.73 (ref >59)

## 2024-09-25 LAB — LIPID PANEL
Chol/HDL Ratio: 3.4 ratio (ref 0.0–4.4)
Cholesterol, Total: 236 mg/dL — ABNORMAL HIGH (ref 100–199)
HDL: 69 mg/dL (ref >39)
LDL Chol Calc (NIH): 142 mg/dL — ABNORMAL HIGH (ref 0–99)
Triglycerides: 143 mg/dL (ref 0–149)
VLDL Cholesterol Cal: 25 mg/dL (ref 5–40)

## 2024-09-25 LAB — TSH: TSH: 2.81 u[IU]/mL (ref 0.450–4.500)

## 2024-10-07 ENCOUNTER — Other Ambulatory Visit: Payer: Self-pay

## 2024-10-07 MED ORDER — ONDANSETRON HCL 4 MG PO TABS
4.0000 mg | ORAL_TABLET | Freq: Three times a day (TID) | ORAL | 0 refills | Status: AC | PRN
  Filled 2024-10-07: qty 30, 10d supply, fill #0

## 2024-11-23 ENCOUNTER — Other Ambulatory Visit: Payer: Self-pay

## 2024-11-23 ENCOUNTER — Ambulatory Visit: Admitting: Dermatology

## 2024-11-23 DIAGNOSIS — Z79899 Other long term (current) drug therapy: Secondary | ICD-10-CM | POA: Diagnosis not present

## 2024-11-23 DIAGNOSIS — L281 Prurigo nodularis: Secondary | ICD-10-CM

## 2024-11-23 DIAGNOSIS — L280 Lichen simplex chronicus: Secondary | ICD-10-CM

## 2024-11-23 DIAGNOSIS — L732 Hidradenitis suppurativa: Secondary | ICD-10-CM

## 2024-11-23 MED ORDER — SPIRONOLACTONE 100 MG PO TABS
100.0000 mg | ORAL_TABLET | Freq: Every day | ORAL | 1 refills | Status: AC
  Filled 2024-11-23 (×2): qty 90, 90d supply, fill #0

## 2024-11-23 NOTE — Progress Notes (Signed)
 Follow Up Visit   Subjective  Megan Hunter is a 48 y.o. female who presents for the following: 6 month follow-up Hidradenitis Suppurativa of the groin and axilla, much improved with no breakouts since last visit. She takes spironolactone  100 mg daily, PanOxyl wash in shower, and only uses clindamycin  lotion PRN.   LSC improving at right shoulder and right upper arm with clobetasol  cream as needed ~ 2x/wk, itchy at times, scratches at night.    The following portions of the chart were reviewed this encounter and updated as appropriate: medications, allergies, medical history  Review of Systems:  No other skin or systemic complaints except as noted in HPI or Assessment and Plan.  Objective  Well appearing patient in no apparent distress; mood and affect are within normal limits.  A focused examination was performed of the following areas:  Face, arms  Relevant exam findings are noted in the Assessment and Plan.    Assessment & Plan   HIDRADENITIS SUPPURATIVA   Related Medications spironolactone  (ALDACTONE ) 100 MG tablet Take 1 tablet (100 mg total) by mouth daily.  HIDRADENITIS SUPPURATIVA Exam: Not examined today, clear per patient.   Chronic condition with duration or expected duration over one year. Currently well-controlled.  Hidradenitis Suppurativa is a chronic; persistent; non-curable, but treatable condition due to abnormal inflamed sweat glands in the body folds (axilla, inframammary, groin, medial thighs), causing recurrent painful draining cysts and scarring. It can be associated with severe scarring acne and cysts; also abscesses and scarring of scalp. The goal is control and prevention of flares, as it is not curable. Scars are permanent and can be thickened. Treatment may include daily use of topical medication and oral antibiotics.  Oral isotretinoin may also be helpful.  For some cases, Humira or Cosentyx (biologic injections) may be prescribed to decrease the  inflammatory process and prevent flares.  When indicated, inflamed cysts may also be treated surgically.  Treatment Plan: BP 127/90 Continue Spironolactone  100 MG take 1 po every day. Spironolactone  can cause increased urination and cause blood pressure to decrease. Please watch for signs of lightheadedness and be cautious when changing position. It can sometimes cause breast tenderness or an irregular period in premenopausal women. It can also increase potassium. The increase in potassium usually is not a concern unless you are taking other medicines that also increase potassium, so please be sure your doctor knows all of the other medications you are taking. This medication should not be taken by pregnant women.  This medicine should also not be taken together with sulfa drugs like Bactrim (trimethoprim/sulfamethexazole).   Continue Panoxyl wash daily and Clindamycin  lotion QD after shower.   Benzoyl peroxide can cause dryness and irritation of the skin. It can also bleach fabric.    If flares occur restart Doxycycline  100mg  po BID x 2 weeks with food PRN flare.   Doxycycline  should be taken with food to prevent nausea. Do not lay down for 30 minutes after taking. Be cautious with sun exposure and use good sun protection while on this medication. Pregnant women should not take this medication.  Long term medication management.  Patient is using long term (months to years) prescription medication  to control their dermatologic condition.  These medications require periodic monitoring to evaluate for efficacy and side effects and may require periodic laboratory monitoring.   LICHEN SIMPLEX CHRONICUS/PRURIGO NODULARIS Exam: pink patch with excoriation right lower neck; pink firm hyperkeratotic papule right shoulder  Chronic and persistent condition with duration or  expected duration over one year. Condition is improving with treatment but not currently at goal.   Lichen simplex chronicus [facility]) is  a persistent itchy area of thickened skin that is induced by chronic rubbing and/or scratching (chronic dermatitis).  These areas may be pink, hyperpigmented and may have excoriations and bumps (prurigo nodules- PN).  LSC/PN is commonly observed in uncontrolled atopic dermatitis and other forms of eczema, and in other itchy skin conditions (eg, insect bites, scabies).  Sometimes it is not possible to know initial cause of LSC/PN if it has been present for a long time.  It generally responds well to treatment with high potency topical steroids.  It is important to stop rubbing/scratching the area in order to break the itch-scratch-rash-itch cycle, in order for the rash to resolve.   Treatment Plan: Biopsy proven LSC at right shoulder 03/27/2023  Continue clobetasol  cream - spot treat to aa twice daily until improved. Right lower neck, improved and may not need clobetasol . Avoid applying to face, groin, and axilla. Use as directed. Long-term use can cause thinning of the skin.  Avoid picking/rubbing/scratching   Topical steroids (such as triamcinolone , fluocinolone, fluocinonide, mometasone, clobetasol , halobetasol, betamethasone, hydrocortisone) can cause thinning and lightening of the skin if they are used for too long in the same area. Your physician has selected the right strength medicine for your problem and area affected on the body. Please use your medication only as directed by your physician to prevent side effects.    Return in about 6 months (around 05/24/2025) for TBSE, HS, LSC.  I, Andrea Kerns, CMA, am acting as scribe for Rexene Rattler, MD .   Documentation: I have reviewed the above documentation for accuracy and completeness, and I agree with the above.  Rexene Rattler, MD

## 2024-11-23 NOTE — Patient Instructions (Addendum)
 Lichen simplex chronicus [facility]) is a persistent itchy area of thickened skin that is induced by chronic rubbing and/or scratching (chronic dermatitis).  These areas may be pink, hyperpigmented and may have excoriations and bumps (prurigo nodules- PN).  LSC/PN is commonly observed in uncontrolled atopic dermatitis and other forms of eczema, and in other itchy skin conditions (eg, insect bites, scabies).  Sometimes it is not possible to know initial cause of LSC/PN if it has been present for a long time.  It generally responds well to treatment with high potency topical steroids.  It is important to stop rubbing/scratching the area in order to break the itch-scratch-rash-itch cycle, in order for the rash to resolve.   Due to recent changes in healthcare laws, you may see results of your pathology and/or laboratory studies on MyChart before the doctors have had a chance to review them. We understand that in some cases there may be results that are confusing or concerning to you. Please understand that not all results are received at the same time and often the doctors may need to interpret multiple results in order to provide you with the best plan of care or course of treatment. Therefore, we ask that you please give us  2 business days to thoroughly review all your results before contacting the office for clarification. Should we see a critical lab result, you will be contacted sooner.   If You Need Anything After Your Visit  If you have any questions or concerns for your doctor, please call our main line at [phone] and press option 4 to reach your doctor's medical assistant. If no one answers, please leave a voicemail as directed and we will return your call as soon as possible. Messages left after 4 pm will be answered the following business day.   You may also send us  a message via MyChart. We typically respond to MyChart messages within 1-2 business days.  For prescription refills, please ask your  pharmacy to contact our office. Our fax number is [phone].  If you have an urgent issue when the clinic is closed that cannot wait until the next business day, you can page your doctor at the number below.    Please note that while we do our best to be available for urgent issues outside of office hours, we are not available 24/7.   If you have an urgent issue and are unable to reach us , you may choose to seek medical care at your doctor's office, retail clinic, urgent care center, or emergency room.  If you have a medical emergency, please immediately call 911 or go to the emergency department.  Pager Numbers  - Dr. Hester: [phone]  - Dr. Jackquline: [phone]  - Dr. Claudene: [phone]   - Dr. Raymund: [phone]  In the event of inclement weather, please call our main line at [phone] for an update on the status of any delays or closures.  Dermatology Medication Tips: Please keep the boxes that topical medications come in in order to help keep track of the instructions about where and how to use these. Pharmacies typically print the medication instructions only on the boxes and not directly on the medication tubes.   If your medication is too expensive, please contact our office at [phone] option 4 or send us  a message through MyChart.   We are unable to tell what your co-pay for medications will be in advance as this is different depending on your insurance coverage. However, we may be able to  find a substitute medication at lower cost or fill out paperwork to get insurance to cover a needed medication.   If a prior authorization is required to get your medication covered by your insurance company, please allow us  1-2 business days to complete this process.  Drug prices often vary depending on where the prescription is filled and some pharmacies may offer cheaper prices.  The website www.goodrx.com contains coupons for medications through different  pharmacies. The prices here do not account for what the cost may be with help from insurance (it may be cheaper with your insurance), but the website can give you the price if you did not use any insurance.  - You can print the associated coupon and take it with your prescription to the pharmacy.  - You may also stop by our office during regular business hours and pick up a GoodRx coupon card.  - If you need your prescription sent electronically to a different pharmacy, notify our office through [facility] or by phone at [phone] option 4.     Si Usted Necesita Algo Despus de Su Visita  Tambin puede enviarnos un mensaje a travs de Clinical Cytogeneticist. Por lo general respondemos a los mensajes de MyChart en el transcurso de 1 a 2 das hbiles.  Para renovar recetas, por favor pida a su farmacia que se ponga en contacto con nuestra oficina. Randi lakes de fax es [place] [phone].  Si tiene un asunto urgente cuando la clnica est cerrada y que no puede esperar hasta el siguiente da hbil, puede llamar/localizar a su doctor(a) al nmero que aparece a continuacin.   Por favor, tenga en cuenta que aunque hacemos todo lo posible para estar disponibles para asuntos urgentes fuera del horario de [place], no estamos disponibles las 24 horas del da, los 7 [address]  [address] de la [place].   Si tiene un problema urgente y no puede comunicarse con nosotros, puede optar por buscar atencin mdica  en el consultorio de su doctor(a), en una clnica privada, en un centro de atencin urgente o en una sala de emergencias.  Si tiene engineer, drilling, por favor llame inmediatamente al 911 o vaya a la sala de emergencias.  Nmeros de bper  - Dr. Hester: [phone]  - Dra. Jackquline: [phone]  - Dr. Claudene: [phone]  - Dra. Kitts: [phone]  En caso de inclemencias del [place], por favor llame a nuestra lnea principal al [phone] para una actualizacin sobre el estado de cualquier retraso o  cierre.  Consejos para la medicacin en dermatologa: Por favor, guarde las cajas en las que vienen los medicamentos de uso tpico para ayudarle a seguir las instrucciones sobre dnde y cmo usarlos. Las farmacias generalmente imprimen las instrucciones del medicamento slo en las cajas y no directamente en los tubos del [place].   Si su medicamento es muy caro, por favor, pngase en contacto con landry rieger llamando al [phone] y presione la opcin 4 o envenos un mensaje a travs de Clinical Cytogeneticist.   No podemos decirle cul ser su copago por los medicamentos por adelantado ya que esto es diferente dependiendo de la cobertura de su seguro. Sin embargo, es posible que podamos encontrar un medicamento sustituto a audiological scientist un formulario para que el seguro cubra el medicamento que se considera necesario.   Si se requiere una autorizacin previa para que su compaa de seguros cubra su medicamento, por favor permtanos de 1 a 2 das hbiles para completar este proceso.  Los precios de los  medicamentos varan con frecuencia dependiendo del lugar de dnde se surte la receta y alguna farmacias pueden ofrecer precios ms baratos.  El sitio web www.goodrx.com tiene cupones para medicamentos de health and safety inspector. Los precios aqu no tienen en cuenta lo que podra costar con la ayuda del seguro (puede ser ms barato con su seguro), pero el sitio web puede darle el precio si no utiliz tourist information centre manager.  - Puede imprimir el cupn correspondiente y llevarlo con su receta a la farmacia.  - Tambin puede pasar por nuestra oficina durante el horario de atencin regular y education officer, museum una tarjeta de cupones de GoodRx.  - Si necesita que su receta se enve electrnicamente a una farmacia diferente, informe a nuestra oficina a travs de MyChart de [place] o por telfono llamando al [phone] y presione la opcin 4.

## 2024-12-02 ENCOUNTER — Other Ambulatory Visit: Payer: Self-pay

## 2025-06-07 ENCOUNTER — Ambulatory Visit: Admitting: Dermatology

## 2025-09-28 ENCOUNTER — Encounter: Admitting: Nurse Practitioner
